# Patient Record
Sex: Male | Born: 1975 | Race: White | Hispanic: No | Marital: Married | Smoking: Never smoker
Health system: Southern US, Community
[De-identification: ages and names within clinical notes are randomized; demographics above are authoritative.]

## PROBLEM LIST (undated history)

## (undated) DIAGNOSIS — L84 Corns and callosities: Secondary | ICD-10-CM

## (undated) DIAGNOSIS — E119 Type 2 diabetes mellitus without complications: Secondary | ICD-10-CM

## (undated) DIAGNOSIS — Z713 Dietary counseling and surveillance: Secondary | ICD-10-CM

## (undated) DIAGNOSIS — R7989 Other specified abnormal findings of blood chemistry: Secondary | ICD-10-CM

## (undated) HISTORY — DX: Dietary counseling and surveillance: Z71.3

## (undated) HISTORY — DX: Corns and callosities: L84

## (undated) HISTORY — PX: VASECTOMY: SHX75

## (undated) HISTORY — PX: APPENDECTOMY: SHX54

## (undated) HISTORY — DX: Other specified abnormal findings of blood chemistry: R79.89

---

## 2008-06-16 ENCOUNTER — Ambulatory Visit: Payer: Self-pay | Admitting: Specialist

## 2011-03-06 ENCOUNTER — Emergency Department (HOSPITAL_COMMUNITY): Payer: BC Managed Care – PPO

## 2011-03-06 ENCOUNTER — Emergency Department (HOSPITAL_COMMUNITY)
Admission: EM | Admit: 2011-03-06 | Discharge: 2011-03-06 | Disposition: A | Discharge status: Home / Self Care | Payer: BC Managed Care – PPO | Attending: Emergency Medicine | Admitting: Emergency Medicine

## 2011-03-06 DIAGNOSIS — W208XXA Other cause of strike by thrown, projected or falling object, initial encounter: Secondary | ICD-10-CM | POA: Insufficient documentation

## 2011-03-06 DIAGNOSIS — Z794 Long term (current) use of insulin: Secondary | ICD-10-CM | POA: Insufficient documentation

## 2011-03-06 DIAGNOSIS — M79609 Pain in unspecified limb: Secondary | ICD-10-CM | POA: Insufficient documentation

## 2011-03-06 DIAGNOSIS — S9030XA Contusion of unspecified foot, initial encounter: Secondary | ICD-10-CM | POA: Insufficient documentation

## 2011-03-06 DIAGNOSIS — IMO0002 Reserved for concepts with insufficient information to code with codable children: Secondary | ICD-10-CM | POA: Insufficient documentation

## 2011-03-06 DIAGNOSIS — S99929A Unspecified injury of unspecified foot, initial encounter: Secondary | ICD-10-CM | POA: Insufficient documentation

## 2011-03-06 DIAGNOSIS — S8990XA Unspecified injury of unspecified lower leg, initial encounter: Secondary | ICD-10-CM | POA: Insufficient documentation

## 2011-03-06 DIAGNOSIS — Y92009 Unspecified place in unspecified non-institutional (private) residence as the place of occurrence of the external cause: Secondary | ICD-10-CM | POA: Insufficient documentation

## 2011-03-06 DIAGNOSIS — M25579 Pain in unspecified ankle and joints of unspecified foot: Secondary | ICD-10-CM | POA: Insufficient documentation

## 2011-03-06 DIAGNOSIS — E119 Type 2 diabetes mellitus without complications: Secondary | ICD-10-CM | POA: Insufficient documentation

## 2011-11-11 ENCOUNTER — Ambulatory Visit: Payer: Self-pay

## 2011-11-19 ENCOUNTER — Ambulatory Visit: Payer: Self-pay

## 2011-11-19 LAB — PSA: PSA: NORMAL

## 2011-12-20 ENCOUNTER — Ambulatory Visit: Payer: Self-pay

## 2012-01-17 ENCOUNTER — Ambulatory Visit: Payer: Self-pay

## 2013-06-28 LAB — LIPID PANEL
CHOLESTEROL: 165 mg/dL (ref 0–200)
HDL: 49 mg/dL (ref 35–70)
LDL CALC: 99 mg/dL
TRIGLYCERIDES: 85 mg/dL (ref 40–160)

## 2013-09-13 ENCOUNTER — Ambulatory Visit: Payer: Self-pay | Admitting: General Practice

## 2013-09-18 ENCOUNTER — Ambulatory Visit: Payer: Self-pay | Admitting: General Practice

## 2014-01-19 LAB — HEMOGLOBIN A1C: HEMOGLOBIN A1C: 6.6

## 2014-10-20 DIAGNOSIS — E109 Type 1 diabetes mellitus without complications: Secondary | ICD-10-CM | POA: Insufficient documentation

## 2015-01-03 ENCOUNTER — Emergency Department: Payer: Self-pay | Admitting: Emergency Medicine

## 2015-05-16 ENCOUNTER — Ambulatory Visit: Payer: Self-pay | Admitting: Urology

## 2015-05-20 ENCOUNTER — Encounter (HOSPITAL_COMMUNITY): Payer: Self-pay | Admitting: Emergency Medicine

## 2015-05-20 ENCOUNTER — Emergency Department (HOSPITAL_COMMUNITY): Payer: PRIVATE HEALTH INSURANCE

## 2015-05-20 DIAGNOSIS — E119 Type 2 diabetes mellitus without complications: Secondary | ICD-10-CM | POA: Diagnosis not present

## 2015-05-20 DIAGNOSIS — Z794 Long term (current) use of insulin: Secondary | ICD-10-CM | POA: Diagnosis not present

## 2015-05-20 DIAGNOSIS — R51 Headache: Secondary | ICD-10-CM | POA: Diagnosis not present

## 2015-05-20 DIAGNOSIS — R0789 Other chest pain: Secondary | ICD-10-CM | POA: Diagnosis not present

## 2015-05-20 DIAGNOSIS — R079 Chest pain, unspecified: Secondary | ICD-10-CM | POA: Diagnosis present

## 2015-05-20 NOTE — ED Notes (Signed)
Pt. reports intermittent mid/left chest pain for 1 1/2 weeks , mild SOB , no nausea or diaphoresis .

## 2015-05-21 ENCOUNTER — Emergency Department (HOSPITAL_COMMUNITY)
Admission: EM | Admit: 2015-05-21 | Discharge: 2015-05-21 | Disposition: A | Discharge status: Home / Self Care | Payer: PRIVATE HEALTH INSURANCE | Attending: Emergency Medicine | Admitting: Emergency Medicine

## 2015-05-21 DIAGNOSIS — R0789 Other chest pain: Secondary | ICD-10-CM

## 2015-05-21 HISTORY — DX: Type 2 diabetes mellitus without complications: E11.9

## 2015-05-21 LAB — BASIC METABOLIC PANEL WITH GFR
Anion gap: 10 (ref 5–15)
BUN: 10 mg/dL (ref 6–20)
CO2: 28 mmol/L (ref 22–32)
Calcium: 8.9 mg/dL (ref 8.9–10.3)
Chloride: 101 mmol/L (ref 101–111)
Creatinine, Ser: 1.33 mg/dL — ABNORMAL HIGH (ref 0.61–1.24)
GFR calc Af Amer: >60 mL/min (ref >60)
GFR calc non Af Amer: >60 mL/min (ref >60)
Glucose, Bld: 198 mg/dL — ABNORMAL HIGH (ref 65–99)
Potassium: 3.5 mmol/L (ref 3.5–5.1)
Sodium: 139 mmol/L (ref 135–145)

## 2015-05-21 LAB — CBC
HCT: 48.3 % (ref 39.0–52.0)
Hemoglobin: 17 g/dL (ref 13.0–17.0)
MCH: 30.1 pg (ref 26.0–34.0)
MCHC: 35.2 g/dL (ref 30.0–36.0)
MCV: 85.5 fL (ref 78.0–100.0)
Platelets: 228 K/uL (ref 150–400)
RBC: 5.65 MIL/uL (ref 4.22–5.81)
RDW: 12.5 % (ref 11.5–15.5)
WBC: 6.9 K/uL (ref 4.0–10.5)

## 2015-05-21 LAB — BRAIN NATRIURETIC PEPTIDE: B Natriuretic Peptide: 6.9 pg/mL (ref 0.0–100.0)

## 2015-05-21 LAB — I-STAT TROPONIN, ED: TROPONIN I, POC: 0 ng/mL (ref 0.00–0.08)

## 2015-05-21 LAB — D-DIMER, QUANTITATIVE (NOT AT ARMC)

## 2015-05-21 MED ORDER — DIPHENHYDRAMINE HCL 50 MG/ML IJ SOLN
25.0000 mg | Freq: Once | INTRAMUSCULAR | Status: AC
  Administered 2015-05-21: 25 mg via INTRAVENOUS
  Filled 2015-05-21: qty 1

## 2015-05-21 MED ORDER — SODIUM CHLORIDE 0.9 % IV BOLUS (SEPSIS)
1000.0000 mL | INTRAVENOUS | Status: AC
  Administered 2015-05-21: 1000 mL via INTRAVENOUS

## 2015-05-21 MED ORDER — METOCLOPRAMIDE HCL 5 MG/ML IJ SOLN
5.0000 mg | Freq: Once | INTRAMUSCULAR | Status: AC
  Administered 2015-05-21: 5 mg via INTRAVENOUS
  Filled 2015-05-21: qty 2

## 2015-05-21 NOTE — Discharge Instructions (Signed)

## 2015-05-21 NOTE — ED Provider Notes (Addendum)
CSN: [id number]     Arrival date & time 05/20/15  2329 History  This chart was scribed for Purvis SheffieldForrest Seiya Silsby, MD by Murriel HopperAlec Bankhead, ED Scribe. This patient was seen in room B14C/B14C and the patient's care was started at 12:45 AM.      Chief Complaint  Patient presents with  . Chest Pain      Patient is a 39 y.o. male presenting with chest pain. The history is provided by the patient. No language interpreter was used.  Chest Pain Pain location:  Unable to specify Pain quality: pressure   Pain radiates to:  L arm and R arm Pain radiates to the back: no   Pain severity:  Mild Onset quality:  Sudden Timing:  Intermittent Progression:  Waxing and waning Chronicity:  Recurrent Associated symptoms: headache   Associated symptoms: no abdominal pain, no back pain, no cough and no fatigue      HPI Comments: Carlos Wilkins is a 39 y.o. male with a Hx of DM who presents to the Emergency Department complaining of intermittent, waxing and waning, recurrent, 2/10, pressure chest pain with associated headache and hypertension that has been present for a week and a half. Pt reports that he went to a fire station earlier this evening to have his BP checked and states his initial reading was 173/100, and then had an EKG performed by EMS, which they stated was normal. Pt states that he feels, "like his chest is going to explode" and notes that it came on while he was driving today, but notes that it has been occurring while he is at work for the past week and a half. Pt also reports that earlier today he had pain radiating to his fingers when the onset of his chest pain occurred. Pt reports taking aleve for his pain with little relief, and denies having chest pain before a week and a half ago. Pt denies a hx of heart disease, blood clots. Pt reports having a vasectomy 2 weeks ago.  Past Medical History  Diagnosis Date  . Diabetes mellitus without complication    Past Surgical History  Procedure  Laterality Date  . Appendectomy    . Vasectomy     No family history on file. History  Substance Use Topics  . Smoking status: Never Smoker   . Smokeless tobacco: Not on file  . Alcohol Use: No    Review of Systems  Constitutional: Negative for appetite change and fatigue.  HENT: Negative for congestion, ear discharge and sinus pressure.   Eyes: Negative for discharge.  Respiratory: Negative for cough.   Cardiovascular: Negative for chest pain.  Gastrointestinal: Negative for abdominal pain and diarrhea.  Genitourinary: Negative for frequency and hematuria.  Musculoskeletal: Negative for back pain.  Skin: Negative for rash.  Neurological: Positive for headaches. Negative for seizures.  Psychiatric/Behavioral: Negative for hallucinations.  All other systems reviewed and are negative.     Allergies  Review of patient's allergies indicates no known allergies.  Home Medications   Prior to Admission medications   Medication Sig Start Date End Date Taking? Authorizing Provider  Insulin Human (INSULIN PUMP) SOLN Inject into the skin continuous. Humalog Insulin   Yes Historical Provider, MD   BP 144/87 mmHg  Pulse 98  Temp(Src) 98.3 F (36.8 C) (Oral)  Resp 20  Ht 5\' 3"  (1.6 m)  Wt 190 lb (86.183 kg)  BMI 33.67 kg/m2  SpO2 97% Physical Exam  Constitutional: He is oriented to person, place, and  time. He appears well-developed and well-nourished.  HENT:  Head: Normocephalic and atraumatic.  Right Ear: External ear normal.  Left Ear: External ear normal.  Nose: Nose normal.  Mouth/Throat: Oropharynx is clear and moist. No oropharyngeal exudate.  Eyes: Conjunctivae and EOM are normal. Pupils are equal, round, and reactive to light.  Neck: Normal range of motion. Neck supple.  Cardiovascular: Normal rate, regular rhythm, normal heart sounds and intact distal pulses.  Exam reveals no gallop and no friction rub.   No murmur heard. Pulmonary/Chest: Effort normal and breath  sounds normal. No respiratory distress. He has no wheezes.  Abdominal: Soft. Bowel sounds are normal. He exhibits no distension. There is no tenderness. There is no rebound and no guarding.  Musculoskeletal: Normal range of motion. He exhibits no edema or tenderness.  Neurological: He is alert and oriented to person, place, and time.  Skin: Skin is warm and dry.  Psychiatric: He has a normal mood and affect. His behavior is normal.  Nursing note and vitals reviewed.   ED Course  Procedures (including critical care time)  DIAGNOSTIC STUDIES: Oxygen Saturation is 97% on room air, normal by my interpretation.    COORDINATION OF CARE: 12:55 AM Discussed treatment plan with pt at bedside and pt agreed to plan.   Labs Review Labs Reviewed  BASIC METABOLIC PANEL - Abnormal; Notable for the following:    Glucose, Bld 198 (*)    Creatinine, Ser 1.33 (*)    All other components within normal limits  CBC  BRAIN NATRIURETIC PEPTIDE  D-DIMER, QUANTITATIVE (NOT AT [facility])  Rosezena Sensor, ED    Imaging Review Dg Chest 2 View  05/21/2015   CLINICAL DATA:  Mid to left-sided chest pain and dyspnea.  EXAM: CHEST  2 VIEW  COMPARISON:  01/03/2015  FINDINGS: The heart size and mediastinal contours are within normal limits. Both lungs are clear. The visualized skeletal structures are unremarkable.  IMPRESSION: No active cardiopulmonary disease.   Electronically Signed   By: Ellery Plunk M.D.   On: 05/21/2015 01:08     EKG Interpretation   Date/Time:  Saturday May 20 2015 23:36:19 EDT Ventricular Rate:  95 PR Interval:  130 QRS Duration: 78 QT Interval:  322 QTC Calculation: 404 R Axis:   81 Text Interpretation:  Normal sinus rhythm Right atrial enlargement  Borderline ECG No significant change since last tracing Confirmed by  Russell Quinney  MD, Saryah Loper ([id number]) on 05/21/2015 12:42:29 AM      MDM   Final diagnoses:  Other chest pain    2:41 AM 39 y.o. male with history of diabetes who  presents with left-sided chest pressure which he has had for approximately 1.5 weeks. Pain is constant and does seem to wax and wane throughout the day. Does not seem to be related to exertion. Does seem worse with deep breathing. He is afebrile and vital signs are unremarkable here. Screening lab work including d-dimer noncontributory. Patient continues to appear well on exam. CP atypical. Only real RF is DM.   2:41 AM: I interpreted/reviewed the labs and/or imaging which were non-contributory.  Low risk for MACE per HEART score.  I have discussed the diagnosis/risks/treatment options with the patient and family and believe the pt to be eligible for discharge home to follow-up with his pcp in 2-3 days for further cardiac workup. We also discussed returning to the ED immediately if new or worsening sx occur. We discussed the sx which are most concerning (e.g., worsening pain, sob, fever)  that necessitate immediate return. Medications administered to the patient during their visit and any new prescriptions provided to the patient are listed below.  Medications given during this visit Medications  sodium chloride 0.9 % bolus 1,000 mL (1,000 mLs Intravenous New Bag/Given 05/21/15 0132)  metoCLOPramide (REGLAN) injection 5 mg (5 mg Intravenous Given 05/21/15 0131)  diphenhydrAMINE (BENADRYL) injection 25 mg (25 mg Intravenous Given 05/21/15 0132)    New Prescriptions   No medications on file      I personally performed the services described in this documentation, which was scribed in my presence. The recorded information has been reviewed and is accurate.    Purvis Sheffield, MD 05/21/15 6045  Purvis Sheffield, MD 05/31/15 [id number]

## 2015-09-22 ENCOUNTER — Encounter: Payer: Self-pay | Admitting: *Deleted

## 2015-09-22 ENCOUNTER — Emergency Department
Admission: EM | Admit: 2015-09-22 | Discharge: 2015-09-22 | Disposition: A | Discharge status: Home / Self Care | Payer: No Typology Code available for payment source | Attending: Emergency Medicine | Admitting: Emergency Medicine

## 2015-09-22 DIAGNOSIS — Y9389 Activity, other specified: Secondary | ICD-10-CM | POA: Insufficient documentation

## 2015-09-22 DIAGNOSIS — E119 Type 2 diabetes mellitus without complications: Secondary | ICD-10-CM | POA: Diagnosis not present

## 2015-09-22 DIAGNOSIS — Y998 Other external cause status: Secondary | ICD-10-CM | POA: Diagnosis not present

## 2015-09-22 DIAGNOSIS — X58XXXA Exposure to other specified factors, initial encounter: Secondary | ICD-10-CM | POA: Diagnosis not present

## 2015-09-22 DIAGNOSIS — Z794 Long term (current) use of insulin: Secondary | ICD-10-CM | POA: Insufficient documentation

## 2015-09-22 DIAGNOSIS — S8992XA Unspecified injury of left lower leg, initial encounter: Secondary | ICD-10-CM | POA: Insufficient documentation

## 2015-09-22 DIAGNOSIS — Y9289 Other specified places as the place of occurrence of the external cause: Secondary | ICD-10-CM | POA: Diagnosis not present

## 2015-09-22 DIAGNOSIS — M25562 Pain in left knee: Secondary | ICD-10-CM

## 2015-09-22 MED ORDER — OXYCODONE-ACETAMINOPHEN 5-325 MG PO TABS: 1.0000 | ORAL_TABLET | Freq: Four times a day (QID) | ORAL | Status: DC | PRN

## 2015-09-22 MED ORDER — MELOXICAM 15 MG PO TABS: 15.0000 mg | ORAL_TABLET | Freq: Every day | ORAL | Status: DC

## 2015-09-22 NOTE — ED Provider Notes (Signed)
[facility] Emergency Department Provider Note  ____________________________________________  Time seen: Approximately 9:57 PM  I have reviewed the triage vital signs and the nursing notes.   HISTORY  Chief Complaint Leg Pain    HPI Carlos Wilkins is a 39 y.o. male who presents to emergency department complaining of pain behind his left knee. He states that he has had a several month history of some "pulled muscle sensation" in the posterior aspect of his knee. Tonight he was loading the dishwasher planted on that foot and felt a popping sensation in the posterior aspect of his knee. He reports immediate pain and swelling to posterior knee. Denies any other injury. He states the pain is sharp, constant, and severe, worse with movement.   Past Medical History  Diagnosis Date  . Diabetes mellitus without complication (HCC)     There are no active problems to display for this patient.   Past Surgical History  Procedure Laterality Date  . Appendectomy    . Vasectomy      Current Outpatient Rx  Name  Route  Sig  Dispense  Refill  . insulin glargine (LANTUS) 100 UNIT/ML injection   Subcutaneous   Inject into the skin at bedtime.         . insulin NPH Human (HUMULIN N,NOVOLIN N) 100 UNIT/ML injection   Subcutaneous   Inject into the skin.         . meloxicam (MOBIC) 15 MG tablet   Oral   Take 1 tablet (15 mg total) by mouth daily.   30 tablet   0   . oxyCODONE-acetaminophen (ROXICET) 5-325 MG tablet   Oral   Take 1 tablet by mouth every 6 (six) hours as needed for severe pain.   20 tablet   0     Allergies Review of patient's allergies indicates no known allergies.  History reviewed. No pertinent family history.  Social History Social History  Substance Use Topics  . Smoking status: Never Smoker   . Smokeless tobacco: None  . Alcohol Use: No    Review of Systems Constitutional: No fever/chills Eyes: No visual changes. ENT:  No sore throat. Cardiovascular: Denies chest pain. Respiratory: Denies shortness of breath. Gastrointestinal: No abdominal pain.  No nausea, no vomiting.  No diarrhea.  No constipation. Genitourinary: Negative for dysuria. Musculoskeletal: Negative for back pain. Dorsiflex left knee pain. Skin: Negative for rash. Neurological: Negative for headaches, focal weakness or numbness.  10-point ROS otherwise negative.  ____________________________________________   PHYSICAL EXAM:  VITAL SIGNS: ED Triage Vitals  Enc Vitals Group     BP 09/22/15 2147 154/95 mmHg     Pulse Rate 09/22/15 2147 64     Resp 09/22/15 2147 18     Temp 09/22/15 2147 98.7 F (37.1 C)     Temp Source 09/22/15 2147 Oral     SpO2 09/22/15 2147 99 %     Weight 09/22/15 2147 200 lb (90.719 kg)     Height 09/22/15 2147  (1.6 m)     Head Cir --      Peak Flow --      Pain Score 09/22/15 2151 6     Pain Loc --      Pain Edu? --      Excl. in GC? --     Constitutional: Alert and oriented. Well appearing and in no acute distress. Eyes: Conjunctivae are normal. PERRL. EOMI. Head: Atraumatic. Nose: No congestion/rhinnorhea. Mouth/Throat: Mucous membranes are moist.  Oropharynx non-erythematous. Neck: No stridor.   Cardiovascular: Normal rate, regular rhythm. Grossly normal heart sounds.  Good peripheral circulation. Respiratory: Normal respiratory effort.  No retractions. Lungs CTAB. Gastrointestinal: Soft and nontender. No distention. No abdominal bruits. No CVA tenderness. Musculoskeletal: No visible deformity when comparing right knee with left knee. Patient is tender pop to palpation over the posterior aspect of the knee. Edema is noted to palpation. No tenderness to palpation over anterior aspect or joint lines. Anterior drawer test is positive. Varus and valgus is negative. No joint effusions. Neurologic:  Normal speech and language. No gross focal neurologic deficits are appreciated. No gait  instability. Skin:  Skin is warm, dry and intact. No rash noted. Psychiatric: Mood and affect are normal. Speech and behavior are normal.  ____________________________________________   LABS (all labs ordered are listed, but only abnormal results are displayed)  Labs Reviewed - No data to display ____________________________________________  EKG   ____________________________________________  RADIOLOGY   ____________________________________________   PROCEDURES  Procedure(s) performed: None  Critical Care performed: No  ____________________________________________   INITIAL IMPRESSION / ASSESSMENT AND PLAN / ED COURSE  Pertinent labs & imaging results that were available during my care of the patient were reviewed by me and considered in my medical decision making (see chart for details).  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 I have informed the patient that I am unable to provide a definitive diagnosis without further imaging. He verbalizes understanding of this. I am referring the patient to orthopedics for further follow-up and care. He verbalizes understanding same. The patient is to wear a knee brace and take medications for symptomatic control. Patient is to use ice to reduce the inflammation. Verbalizes understanding of the treatment plan and verbalizes compliance with same. ____________________________________________   FINAL CLINICAL IMPRESSION(S) / ED DIAGNOSES  Final diagnoses:  Knee pain, acute, left      Racheal PatchesJonathan D Jawaan Adachi, PA-C 09/22/15 2213  Sharyn CreamerMark Quale, MD 09/23/15 [id number]

## 2015-09-22 NOTE — Discharge Instructions (Signed)
Cryotherapy °Cryotherapy means treatment with cold. Ice or gel packs can be used to reduce both pain and swelling. Ice is the most helpful within the first 24 to 48 hours after an injury or flare-up from overusing a muscle or joint. Sprains, strains, spasms, burning pain, shooting pain, and aches can all be eased with ice. Ice can also be used when recovering from surgery. Ice is effective, has very few side effects, and is safe for most people to use. °PRECAUTIONS  °Ice is not a safe treatment option for people with: °· Raynaud phenomenon. This is a condition affecting small blood vessels in the extremities. Exposure to cold may cause your problems to return. °· Cold hypersensitivity. There are many forms of cold hypersensitivity, including: °· Cold urticaria. Red, itchy hives appear on the skin when the tissues begin to warm after being iced. °· Cold erythema. This is a red, itchy rash caused by exposure to cold. °· Cold hemoglobinuria. Red blood cells break down when the tissues begin to warm after being iced. The hemoglobin that carry oxygen are passed into the urine because they cannot combine with blood proteins fast enough. °· Numbness or altered sensitivity in the area being iced. °If you have any of the following conditions, do not use ice until you have discussed cryotherapy with your caregiver: °· Heart conditions, such as arrhythmia, angina, or chronic heart disease. °· High blood pressure. °· Healing wounds or open skin in the area being iced. °· Current infections. °· Rheumatoid arthritis. °· Poor circulation. °· Diabetes. °Ice slows the blood flow in the region it is applied. This is beneficial when trying to stop inflamed tissues from spreading irritating chemicals to surrounding tissues. However, if you expose your skin to cold temperatures for too long or without the proper protection, you can damage your skin or nerves. Watch for signs of skin damage due to cold. °HOME CARE INSTRUCTIONS °Follow  these tips to use ice and cold packs safely. °· Place a dry or damp towel between the ice and skin. A damp towel will cool the skin more quickly, so you may need to shorten the time that the ice is used. °· For a more rapid response, add gentle compression to the ice. °· Ice for no more than 10 to 20 minutes at a time. The bonier the area you are icing, the less time it will take to get the benefits of ice. °· Check your skin after 5 minutes to make sure there are no signs of a poor response to cold or skin damage. °· Rest 20 minutes or more between uses. °· Once your skin is numb, you can end your treatment. You can test numbness by very lightly touching your skin. The touch should be so light that you do not see the skin dimple from the pressure of your fingertip. When using ice, most people will feel these normal sensations in this order: cold, burning, aching, and numbness. °· Do not use ice on someone who cannot communicate their responses to pain, such as small children or people with dementia. °HOW TO MAKE AN ICE PACK °Ice packs are the most common way to use ice therapy. Other methods include ice massage, ice baths, and cryosprays. Muscle creams that cause a cold, tingly feeling do not offer the same benefits that ice offers and should not be used as a substitute unless recommended by your caregiver. °To make an ice pack, do one of the following: °· Place crushed ice or a   bag of frozen vegetables in a sealable plastic bag. Squeeze out the excess air. Place this bag inside another plastic bag. Slide the bag into a pillowcase or place a damp towel between your skin and the bag.  Mix 3 parts water with 1 part rubbing alcohol. Freeze the mixture in a sealable plastic bag. When you remove the mixture from the freezer, it will be slushy. Squeeze out the excess air. Place this bag inside another plastic bag. Slide the bag into a pillowcase or place a damp towel between your skin and the bag. SEEK MEDICAL CARE  IF:  You develop Tullis spots on your skin. This may give the skin a blotchy (mottled) appearance.  Your skin turns blue or pale.  Your skin becomes waxy or hard.  Your swelling gets worse. MAKE SURE YOU:   Understand these instructions.  Will watch your condition.  Will get help right away if you are not doing well or get worse.   This information is not intended to replace advice given to you by your health care provider. Make sure you discuss any questions you have with your health care provider.   Document Released: 07/01/2011 Document Revised: 11/25/2014 Document Reviewed: 07/01/2011 Elsevier Interactive Patient Education 2016 [employer].  How to Use a Knee Brace A knee brace is a device that you wear to support your knee, especially if the knee is healing after an injury or surgery. There are several types of knee braces. Some are designed to prevent an injury (prophylactic brace). These are often worn during sports. Others support an injured knee (functional brace) or keep it still while it heals (rehabilitative brace). People with severe arthritis of the knee may benefit from a brace that takes some pressure off the knee (unloader brace). Most knee braces are made from a combination of cloth and metal or plastic.  You may need to wear a knee brace to:  Relieve knee pain.  Help your knee support your weight (improve stability).  Help you walk farther (improve mobility).  Prevent injury.  Support your knee while it heals from surgery or from an injury. RISKS AND COMPLICATIONS Generally, knee braces are very safe to wear. However, problems may occur, including:  Skin irritation that may lead to infection.  Making your condition worse if you wear the brace in the wrong way. HOW TO USE A KNEE BRACE Different braces will have different instructions for use. Your health care provider will tell you or show you:  How to put on your brace.  How to adjust the  brace.  When and how often to wear the brace.  How to remove the brace.  If you will need any assistive devices in addition to the brace, such as crutches or a cane. In general, your brace should:  Have the hinge of the brace line up with the bend of your knee.  Have straps, hooks, or tapes that fasten snugly around your leg.  Not feel too tight or too loose. HOW TO CARE FOR A KNEE BRACE  Check your brace often for signs of damage, such as loose connections or attachments. Your knee brace may get damaged or wear out during normal use.  Wash the fabric parts of your brace with soap and water.  Read the insert that comes with your brace for other specific care instructions. SEEK MEDICAL CARE IF:  Your knee brace is too loose or too tight and you cannot adjust it.  Your knee brace causes skin  redness, swelling, bruising, or irritation.  Your knee brace is not helping.  Your knee brace is making your knee pain worse.   This information is not intended to replace advice given to you by your health care provider. Make sure you discuss any questions you have with your health care provider.   Document Released: 01/25/2004 Document Revised: 07/26/2015 Document Reviewed: 02/27/2015 Elsevier Interactive Patient Education 2016 Else[employer] Inc.   Knee Pain Knee pain is a very common symptom and can have many causes. Knee pain often goes away when you follow your health care provider's instructions for relieving pain and discomfort at home. However, knee pain can develop into a condition that needs treatment. Some conditions may include:  Arthritis caused by wear and tear (osteoarthritis).  Arthritis caused by swelling and irritation (rheumatoid arthritis or gout).  A cyst or growth in your knee.  An infection in your knee joint.  An injury that will not heal.  Damage, swelling, or irritation of the tissues that support your knee (torn ligaments or tendinitis). If your knee pain  continues, additional tests may be ordered to diagnose your condition. Tests may include X-rays or other imaging studies of your knee. You may also need to have fluid removed from your knee. Treatment for ongoing knee pain depends on the cause, but treatment may include:  Medicines to relieve pain or swelling.  Steroid injections in your knee.  Physical therapy.  Surgery. HOME CARE INSTRUCTIONS  Take medicines only as directed by your health care provider.  Rest your knee and keep it raised (elevated) while you are resting.  Do not do things that cause or worsen pain.  Avoid high-impact activities or exercises, such as running, jumping rope, or doing jumping jacks.  Apply ice to the knee area:  Put ice in a plastic bag.  Place a towel between your skin and the bag.  Leave the ice on for 20 minutes, 2-3 times a day.  Ask your health care provider if you should wear an elastic knee support.  Keep a pillow under your knee when you sleep.  Lose weight if you are overweight. Extra weight can put pressure on your knee.  Do not use any tobacco products, including cigarettes, chewing tobacco, or electronic cigarettes. If you need help quitting, ask your health care provider. Smoking may slow the healing of any bone and joint problems that you may have. SEEK MEDICAL CARE IF:  Your knee pain continues, changes, or gets worse.  You have a fever along with knee pain.  Your knee buckles or locks up.  Your knee becomes more swollen. SEEK IMMEDIATE MEDICAL CARE IF:   Your knee joint feels hot to the touch.  You have chest pain or trouble breathing.   This information is not intended to replace advice given to you by your health care provider. Make sure you discuss any questions you have with your health care provider.   Document Released: 09/01/2007 Document Revised: 11/25/2014 Document Reviewed: 06/20/2014 Elsevier Interactive Patient Education [employer] Else[employer] Inc.

## 2015-09-22 NOTE — ED Notes (Signed)
Worsening leg pain x several months.  Extreme pain tonight behind his left knee.  Tenderness on palpation.  No warmth, redness noted.

## 2016-02-07 ENCOUNTER — Ambulatory Visit (INDEPENDENT_AMBULATORY_CARE_PROVIDER_SITE_OTHER): Payer: BLUE CROSS/BLUE SHIELD | Admitting: Family Medicine

## 2016-02-07 ENCOUNTER — Other Ambulatory Visit
Admission: RE | Admit: 2016-02-07 | Discharge: 2016-02-07 | Disposition: A | Discharge status: Home / Self Care | Payer: BLUE CROSS/BLUE SHIELD | Source: Ambulatory Visit | Attending: Family Medicine | Admitting: Family Medicine

## 2016-02-07 ENCOUNTER — Encounter: Payer: Self-pay | Admitting: Family Medicine

## 2016-02-07 ENCOUNTER — Ambulatory Visit
Admission: RE | Admit: 2016-02-07 | Discharge: 2016-02-07 | Disposition: A | Discharge status: Home / Self Care | Payer: BLUE CROSS/BLUE SHIELD | Source: Ambulatory Visit | Attending: Family Medicine | Admitting: Family Medicine

## 2016-02-07 VITALS — BP 124/78 | HR 102 | Temp 98.5°F | Resp 16 | Ht 63.0 in | Wt 221.1 lb

## 2016-02-07 DIAGNOSIS — E291 Testicular hypofunction: Secondary | ICD-10-CM

## 2016-02-07 DIAGNOSIS — R1033 Periumbilical pain: Secondary | ICD-10-CM | POA: Insufficient documentation

## 2016-02-07 DIAGNOSIS — R194 Change in bowel habit: Secondary | ICD-10-CM | POA: Diagnosis not present

## 2016-02-07 DIAGNOSIS — Z79899 Other long term (current) drug therapy: Secondary | ICD-10-CM

## 2016-02-07 DIAGNOSIS — F329 Major depressive disorder, single episode, unspecified: Secondary | ICD-10-CM | POA: Insufficient documentation

## 2016-02-07 DIAGNOSIS — E109 Type 1 diabetes mellitus without complications: Secondary | ICD-10-CM | POA: Diagnosis not present

## 2016-02-07 DIAGNOSIS — R7989 Other specified abnormal findings of blood chemistry: Secondary | ICD-10-CM

## 2016-02-07 DIAGNOSIS — K5730 Diverticulosis of large intestine without perforation or abscess without bleeding: Secondary | ICD-10-CM | POA: Insufficient documentation

## 2016-02-07 DIAGNOSIS — R635 Abnormal weight gain: Secondary | ICD-10-CM | POA: Diagnosis present

## 2016-02-07 DIAGNOSIS — Z13220 Encounter for screening for lipoid disorders: Secondary | ICD-10-CM | POA: Diagnosis not present

## 2016-02-07 DIAGNOSIS — F32A Depression, unspecified: Secondary | ICD-10-CM

## 2016-02-07 LAB — COMPREHENSIVE METABOLIC PANEL
ALBUMIN: 4.3 g/dL (ref 3.5–5.0)
ALT: 18 U/L (ref 17–63)
ANION GAP: 6 (ref 5–15)
AST: 20 U/L (ref 15–41)
Alkaline Phosphatase: 56 U/L (ref 38–126)
BUN: 11 mg/dL (ref 6–20)
CALCIUM: 9.3 mg/dL (ref 8.9–10.3)
CO2: 31 mmol/L (ref 22–32)
Chloride: 100 mmol/L — ABNORMAL LOW (ref 101–111)
Creatinine, Ser: 1.03 mg/dL (ref 0.61–1.24)
GFR calc Af Amer: >60 mL/min (ref >60)
GFR calc non Af Amer: >60 mL/min (ref >60)
GLUCOSE: 163 mg/dL — AB (ref 65–99)
POTASSIUM: 4.2 mmol/L (ref 3.5–5.1)
SODIUM: 137 mmol/L (ref 135–145)
TOTAL PROTEIN: 7.4 g/dL (ref 6.5–8.1)
Total Bilirubin: 2.4 mg/dL — ABNORMAL HIGH (ref 0.3–1.2)

## 2016-02-07 LAB — CBC WITH DIFFERENTIAL/PLATELET
Basophils Absolute: 0.1 10*3/uL (ref 0–0.1)
Basophils Relative: 1 %
EOS PCT: 4 %
Eosinophils Absolute: 0.3 10*3/uL (ref 0–0.7)
HEMATOCRIT: 46.2 % (ref 40.0–52.0)
Hemoglobin: 16.3 g/dL (ref 13.0–18.0)
LYMPHS PCT: 18 %
Lymphs Abs: 1.2 10*3/uL (ref 1.0–3.6)
MCH: 30.8 pg (ref 26.0–34.0)
MCHC: 35.2 g/dL (ref 32.0–36.0)
MCV: 87.6 fL (ref 80.0–100.0)
MONO ABS: 0.6 10*3/uL (ref 0.2–1.0)
MONOS PCT: 9 %
NEUTROS ABS: 4.9 10*3/uL (ref 1.4–6.5)
Neutrophils Relative %: 68 %
PLATELETS: 184 10*3/uL (ref 150–440)
RBC: 5.28 MIL/uL (ref 4.40–5.90)
RDW: 13 % (ref 11.5–14.5)
WBC: 7.1 10*3/uL (ref 3.8–10.6)

## 2016-02-07 LAB — LIPID PANEL
Cholesterol: 182 mg/dL (ref 0–200)
HDL: 49 mg/dL (ref >40)
LDL CALC: 111 mg/dL — AB (ref 0–99)
Total CHOL/HDL Ratio: 3.7 RATIO
Triglycerides: 111 mg/dL (ref <150)
VLDL: 22 mg/dL (ref 0–40)

## 2016-02-07 LAB — TSH: TSH: 3.292 u[IU]/mL (ref 0.350–4.500)

## 2016-02-07 MED ORDER — MELOXICAM 15 MG PO TABS: 15.0000 mg | ORAL_TABLET | Freq: Every day | ORAL | Status: DC

## 2016-02-07 MED ORDER — INSULIN LISPRO 100 UNIT/ML ~~LOC~~ SOLN: 2.0000 [IU] | Freq: Four times a day (QID) | SUBCUTANEOUS | Status: DC | PRN

## 2016-02-07 MED ORDER — CITALOPRAM HYDROBROMIDE 20 MG PO TABS: 20.0000 mg | ORAL_TABLET | Freq: Every day | ORAL | Status: DC

## 2016-02-07 MED ORDER — IOHEXOL 300 MG/ML  SOLN
100.0000 mL | Freq: Once | INTRAMUSCULAR | Status: AC | PRN
  Administered 2016-02-07: 100 mL via INTRAVENOUS

## 2016-02-07 MED ORDER — CIPROFLOXACIN HCL 250 MG PO TABS: 250.0000 mg | ORAL_TABLET | Freq: Two times a day (BID) | ORAL | Status: DC

## 2016-02-07 NOTE — Progress Notes (Signed)
Name: Carlos Wilkins   MRN: [id number]    DOB: 01-27-1976   Date:02/07/2016       Progress Note  Subjective  Chief Complaint  Chief Complaint  Patient presents with  . Umbilical Hernia    patient was diagnosed with an umbilical hernia at the urgent care [place]. he 1st noticed the pain last week.   . Nausea    HPI  Patient has developed periumbilical pain about one week ago. He states pain is worse when touching the area, does not radiate. It is a constant 3-4/10 but it can go higher with pressure or movement. He denies fever or chills, no injuries ( his last gym work out was a few days before the pain started ), he has noticed mild change in bowel movements ( less frequent - about once a day instead of a few times daily ), no blood in stools, no vomiting. He also denies urinary frequency or hematuria. He has been with same sexual for over one year. He went to Urgent care and was diagnosed with an umbilical hernia and advised to follow up with his pcp  DMI: he was seeing endocrinologist in [facility] but had a change in insurance and his last visit there was last Summer. He prefers seeing a new provider locally. He is currently taking insulin, but not sure of the name of his long acting insulin type. He is due for a hgbA1C , he denies any complications from DM.   Depression: he states he has been doing well on Celexa, he states when he is depressed he gets moody. He denies suicidal thoughts or anhedonia at this time  Hypogonadism: doing well at this time, and does not want to have labs done   Patient Active Problem List   Diagnosis Date Noted  . Low testosterone 02/07/2016  . Depression 02/07/2016  . Type 1 diabetes mellitus ([place]) 10/20/2014    Past Surgical History  Procedure Laterality Date  . Appendectomy    . Vasectomy      Family History  Problem Relation Age of Onset  . Post-traumatic stress disorder Father     [place] Vet  . Diabetes Father     Social History    Social History  . Marital Status: Married    Spouse Name: N/A  . Number of Children: N/A  . Years of Education: N/A   Occupational History  . Not on file.   Social History Main Topics  . Smoking status: Never Smoker   . Smokeless tobacco: Not on file  . Alcohol Use: No  . Drug Use: No  . Sexual Activity:    Partners: Female   Other Topics Concern  . Not on file   Social History Narrative     Current outpatient prescriptions:  .  Blood Glucose Monitoring Suppl (CONTOUR NEXT EZ MONITOR) w/Device KIT, , Disp: , Rfl:  .  citalopram (CELEXA) 20 MG tablet, Take 1 tablet (20 mg total) by mouth daily., Disp: 90 tablet, Rfl: 1 .  glucose blood (BAYER CONTOUR NEXT TEST) test strip, , Disp: , Rfl:  .  insulin glargine (LANTUS) 100 UNIT/ML injection, Inject into the skin at bedtime., Disp: , Rfl:  .  insulin lispro (HUMALOG) 100 UNIT/ML injection, Inject 0.02-0.2 mLs (2-20 Units total) into the skin 4 (four) times daily as needed for high blood sugar., Disp: 10 mL, Rfl: 0 .  Multiple Vitamin (MULTIVITAMIN) capsule, Take by mouth., Disp: , Rfl:  .  meloxicam (  MOBIC) 15 MG tablet, Take 1 tablet (15 mg total) by mouth daily., Disp: 30 tablet, Rfl: 0  No Known Allergies   ROS  Ten systems reviewed and is negative except as mentioned in HPI  He also has noticed an increase in his weight over the past couple of weeks  Objective  Filed Vitals:   02/07/16 1153  BP: 124/78  Pulse: 102  Temp: 98.5 F (36.9 C)  TempSrc: Oral  Resp: 16  Height: 5' 3"  (1.6 m)  Weight: 221 lb 1.6 oz (100.29 kg)  SpO2: 98%    Body mass index is 39.18 kg/(m^2).  Physical Exam   Constitutional: Patient appears well-developed and well-nourished. Very muscular. No distress.  HEENT: head atraumatic, normocephalic, pupils equal and reactive to light, neck supple, throat within normal limits Cardiovascular: Normal rate, regular rhythm and normal heart sounds.  No murmur heard. No BLE  edema. Pulmonary/Chest: Effort normal and breath sounds normal. No respiratory distress. Abdominal: Soft. Severe periumbilical and supra pubic tenderness during exam, with voluntary guarding, no bulging noticed during exam on umbilcus Psychiatric: Patient has a normal mood and affect. behavior is normal. Judgment and thought content normal.  Recent Results (from the past 2160 hour(s))  Comprehensive metabolic panel     Status: Abnormal   Collection Time: 02/07/16  1:45 PM  Result Value Ref Range   Sodium 137 135 - 145 mmol/L   Potassium 4.2 3.5 - 5.1 mmol/L   Chloride 100 (L) 101 - 111 mmol/L   CO2 31 22 - 32 mmol/L   Glucose, Bld 163 (H) 65 - 99 mg/dL   BUN 11 6 - 20 mg/dL   Creatinine, Ser 1.03 0.61 - 1.24 mg/dL   Calcium 9.3 8.9 - 10.3 mg/dL   Total Protein 7.4 6.5 - 8.1 g/dL   Albumin 4.3 3.5 - 5.0 g/dL   AST 20 15 - 41 U/L   ALT 18 17 - 63 U/L   Alkaline Phosphatase 56 38 - 126 U/L   Total Bilirubin 2.4 (H) 0.3 - 1.2 mg/dL   GFR calc non Af Amer >60 >60 mL/min   GFR calc Af Amer >60 >60 mL/min    Comment: (NOTE) The eGFR has been calculated using the CKD EPI equation. This calculation has not been validated in all clinical situations. eGFR's persistently <60 mL/min signify possible Chronic Kidney Disease.    Anion gap 6 5 - 15  CBC with Differential/Platelet     Status: None   Collection Time: 02/07/16  1:45 PM  Result Value Ref Range   WBC 7.1 3.8 - 10.6 K/uL   RBC 5.28 4.40 - 5.90 MIL/uL   Hemoglobin 16.3 13.0 - 18.0 g/dL   HCT 46.2 40.0 - 52.0 %   MCV 87.6 80.0 - 100.0 fL   MCH 30.8 26.0 - 34.0 pg   MCHC 35.2 32.0 - 36.0 g/dL   RDW 13.0 11.5 - 14.5 %   Platelets 184 150 - 440 K/uL   Neutrophils Relative % 68 %   Neutro Abs 4.9 1.4 - 6.5 K/uL   Lymphocytes Relative 18 %   Lymphs Abs 1.2 1.0 - 3.6 K/uL   Monocytes Relative 9 %   Monocytes Absolute 0.6 0.2 - 1.0 K/uL   Eosinophils Relative 4 %   Eosinophils Absolute 0.3 0 - 0.7 K/uL   Basophils Relative 1 %    Basophils Absolute 0.1 0 - 0.1 K/uL    Diabetic Foot Exam: Diabetic Foot Exam - Simple   Simple  Foot Form  Diabetic Foot exam was performed with the following findings:  Yes 02/07/2016 12:30 PM  Visual Inspection  No deformities, no ulcerations, no other skin breakdown bilaterally:  Yes  Sensation Testing  Intact to touch and monofilament testing bilaterally:  Yes  Pulse Check  Posterior Tibialis and Dorsalis pulse intact bilaterally:  Yes  Comments      PHQ2/9: Depression screen PHQ 2/9 02/07/2016  Decreased Interest 0  Down, Depressed, Hopeless 0  PHQ - 2 Score 0    Fall Risk: Fall Risk  02/07/2016  Falls in the past year? No     Functional Status Survey: Is the patient deaf or have difficulty hearing?: No Does the patient have difficulty seeing, even when wearing glasses/contacts?: No Does the patient have difficulty concentrating, remembering, or making decisions?: No Does the patient have difficulty walking or climbing stairs?: No Does the patient have difficulty dressing or bathing?: No Does the patient have difficulty doing errands alone such as visiting a doctor's office or shopping?: No    Assessment & Plan  1. Type 1 diabetes mellitus without complication (HCC)  - insulin lispro (HUMALOG) 100 UNIT/ML injection; Inject 0.02-0.2 mLs (2-20 Units total) into the skin 4 (four) times daily as needed for high blood sugar.  Dispense: 10 mL; Refill: 0 - Ambulatory referral to Endocrinology - Comprehensive metabolic panel - Hemoglobin A1c  2. Low testosterone  He is not interested in having labs done at this time  3. Depression  - citalopram (CELEXA) 20 MG tablet; Take 1 tablet (20 mg total) by mouth daily.  Dispense: 90 tablet; Refill: 1  4. Change in bowel movements  - CBC with Differential/Platelet  5. Long-term use of high-risk medication  - Comprehensive metabolic panel  6. Weight gain  - TSH  7. Lipid screening  - Lipid panel  8.  Periumbilical pain  - CT Abdomen Pelvis W Contrast; Future - Urine culture - meloxicam (MOBIC) 15 MG tablet; Take 1 tablet (15 mg total) by mouth daily.  Dispense: 30 tablet; Refill: 0

## 2016-02-07 NOTE — Addendum Note (Signed)
Addended by: Alba CorySOWLES, Micai Apolinar F on: 02/07/2016 04:28 PM   Modules accepted: Orders

## 2016-02-08 ENCOUNTER — Telehealth: Payer: Self-pay

## 2016-02-08 LAB — HEMOGLOBIN A1C: HEMOGLOBIN A1C: 6.6 % — AB (ref 4.0–6.0)

## 2016-02-08 NOTE — Telephone Encounter (Signed)
Patient was informed of Dr. Carlynn PurlSowles message below and was told to call if there was no improvements after completing his medication.  Notes Recorded by Alba CoryKrichna Sowles, MD on 02/07/2016 at 5:09 PM Normal CT, patient has been notified, we will treat him for muscular pain, and also UTI ( sending Cipro and also meloxicam to his pharmacy ) , advised to use warm compresses and to call us back if no improvement of symptoms

## 2016-02-09 ENCOUNTER — Other Ambulatory Visit: Payer: Self-pay

## 2016-02-09 LAB — URINE CULTURE: ORGANISM ID, BACTERIA: NO GROWTH

## 2016-02-09 MED ORDER — INSULIN ASPART 100 UNIT/ML ~~LOC~~ SOLN: 2.0000 [IU] | Freq: Three times a day (TID) | SUBCUTANEOUS | Status: DC

## 2016-02-09 NOTE — Telephone Encounter (Signed)
[employer] prefers Novolog to [employer], can we switch it to that medication instead? Novolog 100Units/ML Vial. Thanks

## 2016-02-09 NOTE — Telephone Encounter (Signed)
Refill request was sent to Dr. Krichna Sowles for approval and submission.  

## 2016-03-06 ENCOUNTER — Encounter: Payer: Self-pay | Admitting: Family Medicine

## 2016-03-06 DIAGNOSIS — L84 Corns and callosities: Secondary | ICD-10-CM | POA: Insufficient documentation

## 2016-03-06 LAB — COMPREHENSIVE METABOLIC PANEL
ALT: 25 IU/L (ref 0–44)
AST: 15 IU/L (ref 0–40)
Albumin/Globulin Ratio: 1.6 (ref 1.2–2.2)
Albumin: 4.1 g/dL (ref 3.5–5.5)
Alkaline Phosphatase: 57 IU/L (ref 39–117)
BUN/Creatinine Ratio: 16 (ref 9–20)
BUN: 20 mg/dL (ref 6–24)
Bilirubin Total: 1.4 mg/dL — ABNORMAL HIGH (ref 0.0–1.2)
CO2: 25 mmol/L (ref 18–29)
Calcium: 8.9 mg/dL (ref 8.7–10.2)
Chloride: 97 mmol/L (ref 96–106)
Creatinine, Ser: 1.22 mg/dL (ref 0.76–1.27)
GFR calc Af Amer: 85 mL/min/{1.73_m2} (ref >59)
GFR calc non Af Amer: 74 mL/min/{1.73_m2} (ref >59)
Globulin, Total: 2.6 g/dL (ref 1.5–4.5)
Glucose: 210 mg/dL — ABNORMAL HIGH (ref 65–99)
Potassium: 4.4 mmol/L (ref 3.5–5.2)
Sodium: 138 mmol/L (ref 134–144)
Total Protein: 6.7 g/dL (ref 6.0–8.5)

## 2016-03-06 LAB — CBC WITH DIFFERENTIAL/PLATELET
Basophils Absolute: 0 10*3/uL (ref 0.0–0.2)
Basos: 1 %
EOS (ABSOLUTE): 0.3 10*3/uL (ref 0.0–0.4)
Eos: 5 %
Hematocrit: 47.4 % (ref 37.5–51.0)
Hemoglobin: 16.2 g/dL (ref 12.6–17.7)
Immature Grans (Abs): 0.1 10*3/uL (ref 0.0–0.1)
Immature Granulocytes: 1 %
Lymphocytes Absolute: 1.3 10*3/uL (ref 0.7–3.1)
Lymphs: 21 %
MCH: 30.8 pg (ref 26.6–33.0)
MCHC: 34.2 g/dL (ref 31.5–35.7)
MCV: 90 fL (ref 79–97)
Monocytes Absolute: 0.4 10*3/uL (ref 0.1–0.9)
Monocytes: 7 %
Neutrophils Absolute: 4 10*3/uL (ref 1.4–7.0)
Neutrophils: 65 %
Platelets: 204 10*3/uL (ref 150–379)
RBC: 5.26 x10E6/uL (ref 4.14–5.80)
RDW: 13.6 % (ref 12.3–15.4)
WBC: 6.1 10*3/uL (ref 3.4–10.8)

## 2016-03-06 LAB — LIPID PANEL
Chol/HDL Ratio: 3.8 ratio units (ref 0.0–5.0)
Cholesterol, Total: 179 mg/dL (ref 100–199)
HDL: 47 mg/dL (ref >39)
LDL Calculated: 114 mg/dL — ABNORMAL HIGH (ref 0–99)
Triglycerides: 92 mg/dL (ref 0–149)
VLDL Cholesterol Cal: 18 mg/dL (ref 5–40)

## 2016-03-06 LAB — HEMOGLOBIN A1C
Est. average glucose Bld gHb Est-mCnc: 146 mg/dL
Hgb A1c MFr Bld: 6.7 % — ABNORMAL HIGH (ref 4.8–5.6)

## 2016-03-06 LAB — TSH: TSH: 2.58 u[IU]/mL (ref 0.450–4.500)

## 2017-03-30 ENCOUNTER — Other Ambulatory Visit: Payer: Self-pay | Admitting: Family Medicine

## 2017-03-31 NOTE — Telephone Encounter (Signed)
Pt has switched insurance and states that you are not covered under his new insurance.

## 2017-04-09 ENCOUNTER — Encounter: Payer: BC Managed Care – PPO | Attending: Internal Medicine | Admitting: *Deleted

## 2017-04-09 DIAGNOSIS — E109 Type 1 diabetes mellitus without complications: Secondary | ICD-10-CM

## 2017-04-09 DIAGNOSIS — E1065 Type 1 diabetes mellitus with hyperglycemia: Secondary | ICD-10-CM | POA: Diagnosis present

## 2017-04-09 NOTE — Progress Notes (Signed)
Insulin Pump and / or CGM Evaluation Visit:  Date: 04/09/2017   Appt start time: 1230 end time:  1330.  Assessment:  This patient has DM 1 and their primary concerns today: moving back to Medtronic 670G insulin pump with Auto Mode due to recent hypoglycemia unawareness.  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 Patient states knowledge of Carb Counting is 10 on a scale of 1-10 Patient states they are currently testing BG more than 4 times per day Last A1c was 6.2   Current barriers to managing their diabetes: Patient states:  they have had hypoglycemia multiple times in the past month  MEDICATIONS: Basal Insulin: 16 units of Trsiba at AM via pen  Bolus Insulin: 16 units of Novolog at meals with Carb Ratio of 1/8 grams and Correction of 1/40 over target of 120 mg/dl  via pen  Total Daily Dose of insulin per day 32 Other diabetes medications: none  Progress Towards Obtaining an Insulin Pump Goal(s):   Patient states their expectations of pump therapy include: more stable BG with excellent control Patient expresses understanding that for improved outcomes for their diabetes on an insulin pump they will:  During the first 1-2 weeks (or more) of new pump use, the patient will test BG pre and 2 hour post each meal, bedtime and 3 AM to assist the provider in evaluating accuracy of pump settings and to prevent DKA   Will test BG at least 4 times per day once evaluation time is completed  Change out pump infusion set at least every 3 days  Upload pump information to their pump's Web Based Program on a regular basis so provider can assess patterns and make setting adjustments.     Intervention:    Pt understands difference between delivery of insulin via syringe/pen compared to insulin pump.  Showed patient the following pumps: Medtronic, per his request  Showed patient the following CGM: Medtronic with Auto Mode  Emphasized  importance of follow up after Pump Start for appropriate pump setting adjustments and on-going training on more advanced features.  Handouts given during visit include:  Hand written review of Auto Mode  DM 1/Pump Support Group Flyer  Insulin Pump and /or CGM Packet from Med[employer]onic  Carb Counting handout if needed  Monitoring/Evaluation:    Patient has already started the process of Medtronic 670G insulin pump with Auto Mode.  Once pump / CGM is shipped, patient to call this office to set up training.or we will reach out to [employer] Clinical Manager if she can train sooner as patient needs to get on pump ASAP due to work requirements.

## 2018-03-03 ENCOUNTER — Ambulatory Visit
Admission: RE | Admit: 2018-03-03 | Discharge: 2018-03-03 | Disposition: A | Discharge status: Home / Self Care | Payer: BC Managed Care – PPO | Source: Ambulatory Visit | Attending: Internal Medicine | Admitting: Internal Medicine

## 2018-03-03 ENCOUNTER — Other Ambulatory Visit: Payer: Self-pay | Admitting: Internal Medicine

## 2018-03-03 DIAGNOSIS — M5442 Lumbago with sciatica, left side: Secondary | ICD-10-CM

## 2018-03-20 ENCOUNTER — Other Ambulatory Visit: Payer: Self-pay | Admitting: Internal Medicine

## 2018-03-20 DIAGNOSIS — M5386 Other specified dorsopathies, lumbar region: Secondary | ICD-10-CM

## 2018-03-24 ENCOUNTER — Other Ambulatory Visit: Payer: Self-pay | Admitting: Internal Medicine

## 2018-03-24 DIAGNOSIS — M5386 Other specified dorsopathies, lumbar region: Secondary | ICD-10-CM

## 2019-05-17 IMAGING — CR DG LUMBAR SPINE COMPLETE 4+V
5 series · 5 of 5 positions shown · non-contrast
Comparison: CT scan of February 07, 2016.

CLINICAL DATA: Acute low back and left leg pain without known
injury.

EXAM:
LUMBAR SPINE - COMPLETE 4+ VIEW

[t l-spine a.p.]
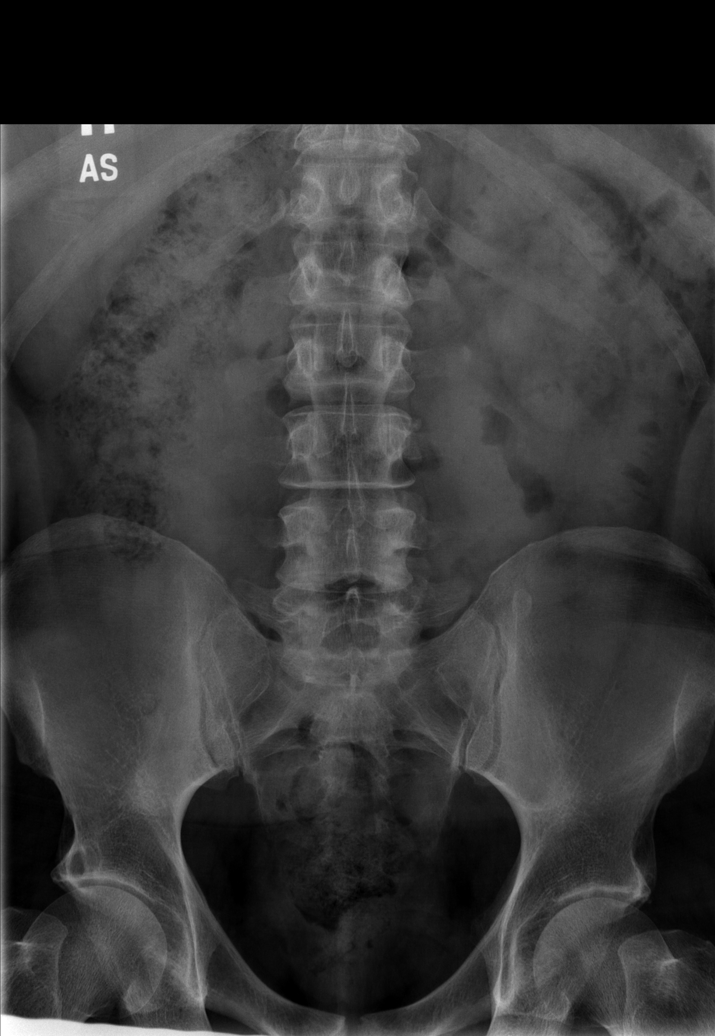

[t l-spine oblique exposure (1 of 2)]
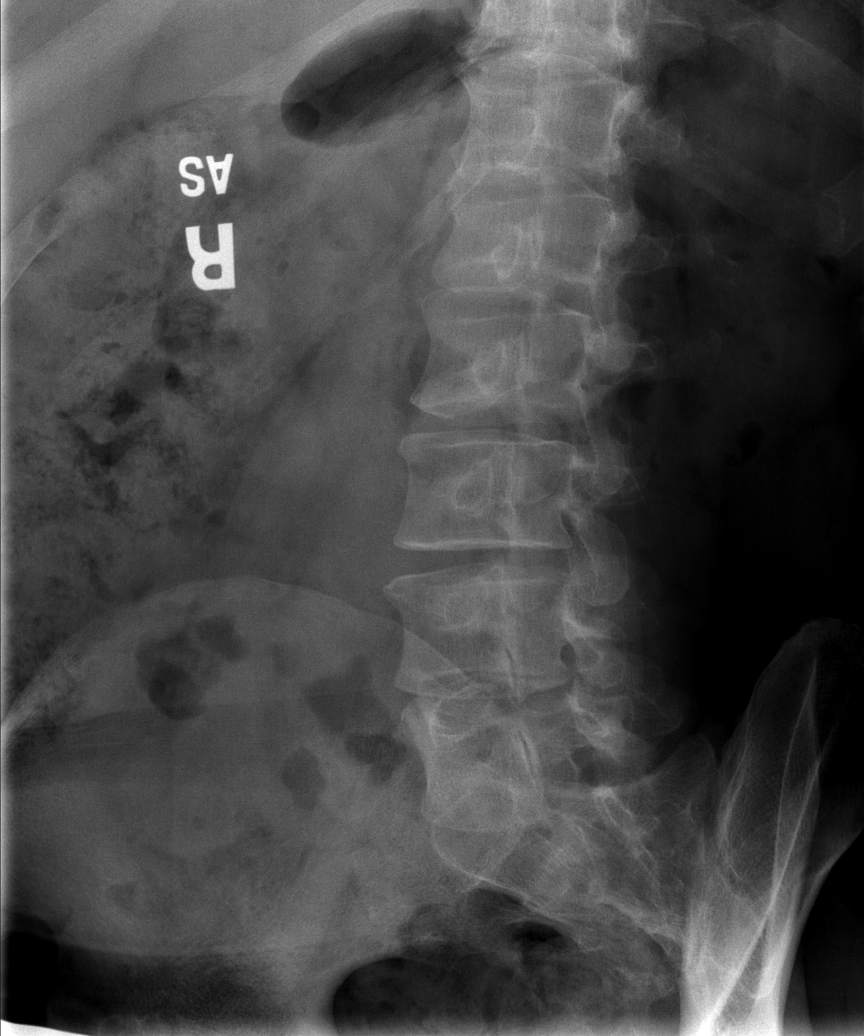

[t l-spine oblique exposure (2 of 2)]
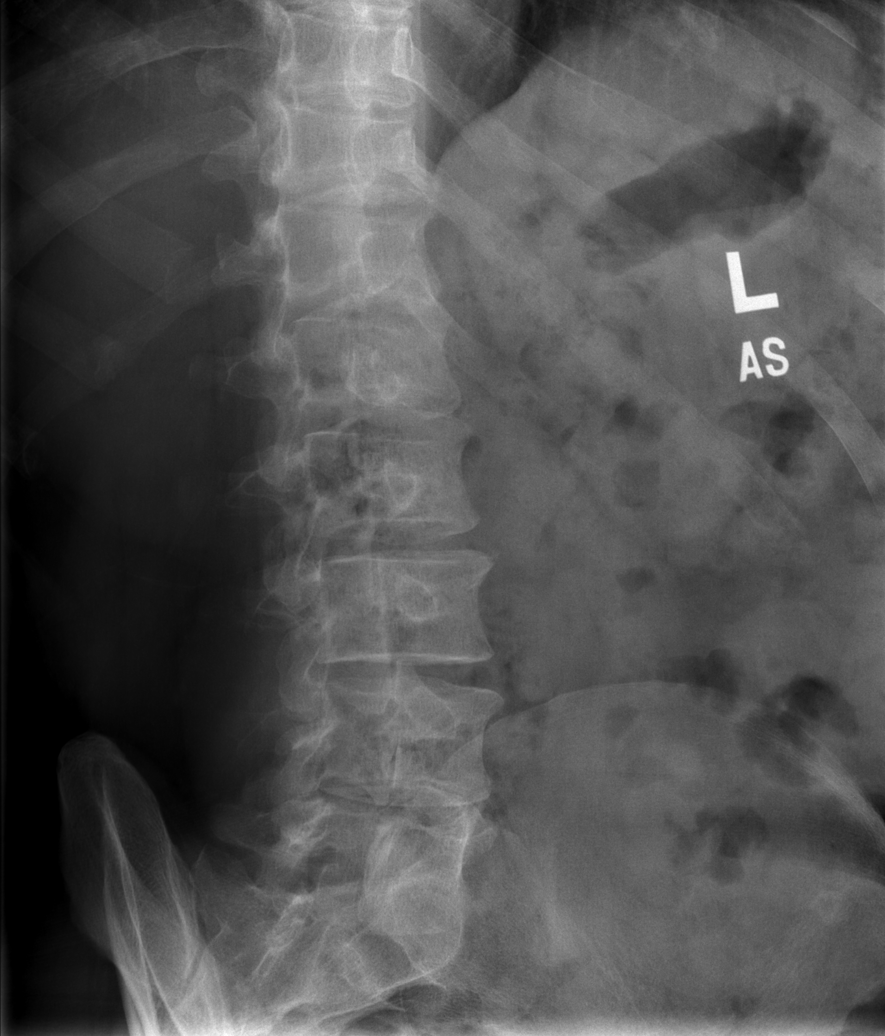

[t l-spine lat]
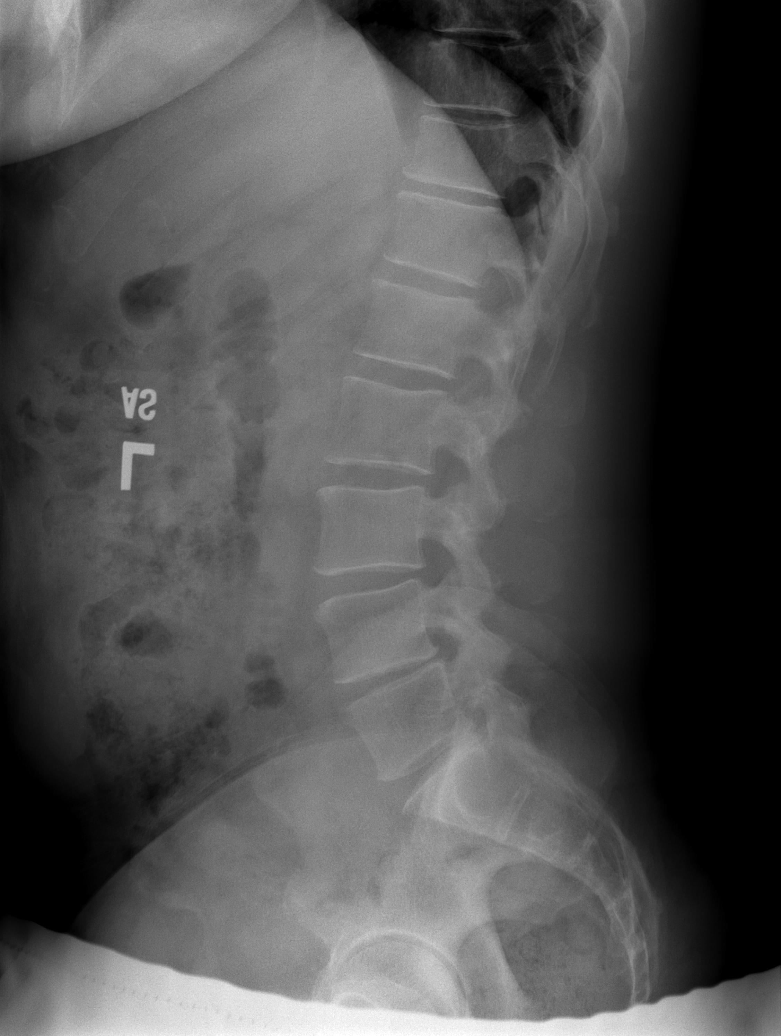

[t l-spine l5-s1 spot]
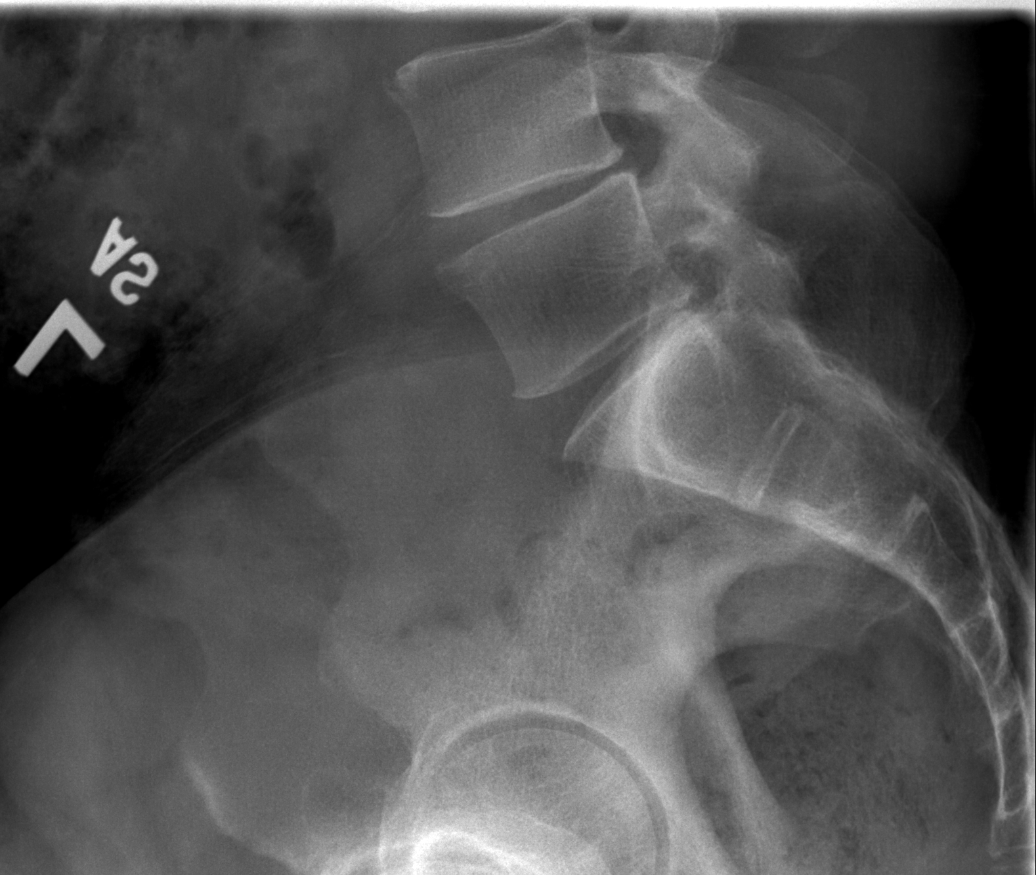

[5 of 5 positions shown; findings below may reference images not displayed]

FINDINGS: No fracture or spondylolisthesis is noted. Moderate degenerative
disc disease is noted at L4-5. Remaining disc spaces appear intact.
IMPRESSION: Moderate degenerative disc disease is noted at L4-5. No acute
abnormality seen in the lumbar spine.

## 2019-09-10 ENCOUNTER — Ambulatory Visit
Admission: EM | Admit: 2019-09-10 | Discharge: 2019-09-10 | Disposition: A | Discharge status: Home / Self Care | Payer: No Typology Code available for payment source | Attending: Family Medicine | Admitting: Family Medicine

## 2019-09-10 ENCOUNTER — Other Ambulatory Visit: Payer: Self-pay

## 2019-09-10 DIAGNOSIS — Z20828 Contact with and (suspected) exposure to other viral communicable diseases: Secondary | ICD-10-CM

## 2019-09-10 DIAGNOSIS — R5383 Other fatigue: Secondary | ICD-10-CM

## 2019-09-10 DIAGNOSIS — Z7189 Other specified counseling: Secondary | ICD-10-CM

## 2019-09-10 DIAGNOSIS — Z20822 Contact with and (suspected) exposure to covid-19: Secondary | ICD-10-CM

## 2019-09-10 DIAGNOSIS — R05 Cough: Secondary | ICD-10-CM | POA: Diagnosis not present

## 2019-09-10 DIAGNOSIS — R0602 Shortness of breath: Secondary | ICD-10-CM | POA: Diagnosis not present

## 2019-09-10 DIAGNOSIS — R058 Other specified cough: Secondary | ICD-10-CM

## 2019-09-10 NOTE — ED Triage Notes (Signed)
Patient complains of fatigue and cough. Reports that he has noticed some Shortness of breath. Symptoms started today. 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

## 2019-09-10 NOTE — Discharge Instructions (Signed)
It was very nice seeing you today in clinic. Thank you for entrusting me with your care.   Rest and Increase fluid intake as much as possible. Water is always best, as sugar and caffeine containing fluids can cause you to become dehydrated. Try to incorporate electrolyte enriched fluids, such as Gatorade or Pedialyte, into your daily fluid intake. May use Tylenol and/or Ibuprofen as needed for pain/fever. Call us if you feel like you are getting worse.   You were tested for SARS-CoV-2 (novel coronavirus) today. Testing is performed by an outside lab ([facility]) and has variable turn around times ranging between 2-5 days. Current recommendations from the the CDC and [place] DHHS require that you remain out of work in order to quarantine at home until negative test results are have been received. In the event that your test results are positive, you will be contacted with further directives. These measures are being implemented out of an abundance of caution to prevent transmission and spread during the current SARS-CoV-2 pandemic.  Make arrangements to follow up with your regular doctor in 1 week for re-evaluation if not improving. If your symptoms/condition worsens, please seek follow up care either here or in the ER. Please remember, our [facility] providers are "right here with you" when you need [place].   Again, it was my pleasure to take care of you today. Thank you for choosing our clinic. I hope that you start to feel better quickly.   Honor Loh, MSN, APRN, FNP-C, CEN Advanced Practice Provider [facility]
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
## 2019-09-12 LAB — NOVEL CORONAVIRUS, NAA (HOSP ORDER, SEND-OUT TO REF LAB; TAT 18-24 HRS): SARS-CoV-2, NAA: NOT DETECTED

## 2019-11-05 ENCOUNTER — Other Ambulatory Visit: Payer: Self-pay

## 2019-11-05 ENCOUNTER — Ambulatory Visit
Admission: EM | Admit: 2019-11-05 | Discharge: 2019-11-05 | Disposition: A | Discharge status: Home / Self Care | Payer: No Typology Code available for payment source | Attending: Family Medicine | Admitting: Family Medicine

## 2019-11-05 DIAGNOSIS — B349 Viral infection, unspecified: Secondary | ICD-10-CM | POA: Diagnosis not present

## 2019-11-05 DIAGNOSIS — R509 Fever, unspecified: Secondary | ICD-10-CM

## 2019-11-05 NOTE — Discharge Instructions (Signed)
Rest, fluids, over the counter medications as needed  

## 2019-11-05 NOTE — ED Provider Notes (Signed)
[facility]    CSN: [id number] Arrival date & time: 11/05/19  1030      History   Chief Complaint Chief Complaint  Patient presents with  . Fever    HPI LUX MEADERS is a 43 y.o. male.   43 yo male with a c/o fevers, cough and congestion since yesterday. Positive covid exposure at work.    Fever   Past Medical History:  Diagnosis Date  . Callus of foot   . Diabetes mellitus without complication ([place])   . High-energy and high-protein diet monitoring encounter   . Low testosterone     Patient Active Problem List   Diagnosis Date Noted  . Callus of foot 03/06/2016  . Low testosterone 02/07/2016  . Depression 02/07/2016  . Type 1 diabetes mellitus ([place]) 10/20/2014    Past Surgical History:  Procedure Laterality Date  . APPENDECTOMY    . VASECTOMY         Home Medications    Prior to Admission medications   Medication Sig Start Date End Date Taking? Authorizing Provider  atorvastatin (LIPITOR) 10 MG tablet Take 10 mg by mouth at bedtime. 07/14/19  Yes [provider]  Blood Glucose Monitoring Suppl (CONTOUR NEXT EZ MONITOR) w/Device KIT  11/30/15  Yes [provider]  glucose blood (BAYER CONTOUR NEXT TEST) test strip  11/30/15  Yes [provider]  HUMALOG 100 UNIT/ML injection inject 2-15 units subcutaneously three times a day before meals 03/31/17  Yes Sowles, Drue Stager, MD  Multiple Vitamin (MULTIVITAMIN) capsule Take by mouth.   Yes [provider]  insulin glargine (LANTUS) 100 UNIT/ML injection Inject into the skin at bedtime.    [provider]  citalopram (CELEXA) 20 MG tablet Take 1 tablet (20 mg total) by mouth daily. 02/07/16 09/10/19  Steele Sizer, MD  insulin aspart (NOVOLOG) 100 UNIT/ML injection Inject 2-15 Units into the skin 3 (three) times daily before meals. 02/09/16 09/10/19  Steele Sizer, MD    Family History Family History  Problem Relation Age of Onset  . Post-traumatic  stress disorder Father        [place] Vet  . Diabetes Father     Social History Social History   Tobacco Use  . Smoking status: Never Smoker  . Smokeless tobacco: Never Used  Substance Use Topics  . Alcohol use: No    Alcohol/week: 0.0 standard drinks  . Drug use: No     Allergies   Patient has no known allergies.   Review of Systems Review of Systems  Constitutional: Positive for fever.     Physical Exam Triage Vital Signs ED Triage Vitals  Enc Vitals Group     BP 11/05/19 1052 (!) 148/88     Pulse Rate 11/05/19 1052 87     Resp 11/05/19 1052 16     Temp 11/05/19 1052 99.3 F (37.4 C)     Temp Source 11/05/19 1052 Oral     SpO2 11/05/19 1052 96 %     Weight 11/05/19 1051 200 lb (90.7 kg)     Height 11/05/19 1051 5' 3"  (1.6 m)     Head Circumference --      Peak Flow --      Pain Score 11/05/19 1050 6     Pain Loc --      Pain Edu? --      Excl. in [place]? --    No data found.  Updated Vital Signs BP (!) 148/88 (BP Location: Left  Arm)   Pulse 87   Temp 99.3 F (37.4 C) (Oral)   Resp 16   Ht 5' 3"  (1.6 m)   Wt 90.7 kg   SpO2 96%   BMI 35.43 kg/m   Visual Acuity Right Eye Distance:   Left Eye Distance:   Bilateral Distance:    Right Eye Near:   Left Eye Near:    Bilateral Near:     Physical Exam Vitals and nursing note reviewed.  Constitutional:      General: He is not in acute distress.    Appearance: He is not toxic-appearing or diaphoretic.  Cardiovascular:     Rate and Rhythm: Normal rate.  Pulmonary:     Effort: Pulmonary effort is normal. No respiratory distress.     Breath sounds: Normal breath sounds. No stridor. No wheezing, rhonchi or rales.  Neurological:     Mental Status: He is alert.      UC Treatments / Results  Labs (all labs ordered are listed, but only abnormal results are displayed) Labs Reviewed  NOVEL CORONAVIRUS, NAA (HOSP ORDER, SEND-OUT TO REF LAB; TAT 18-24 HRS)    EKG   Radiology No results  found.  Procedures Procedures (including critical care time)  Medications Ordered in UC Medications - No data to display  Initial Impression / Assessment and Plan / UC Course  I have reviewed the triage vital signs and the nursing notes.  Pertinent labs & imaging results that were available during my care of the patient were reviewed by me and considered in my medical decision making (see chart for details).      Final Clinical Impressions(s) / UC Diagnoses   Final diagnoses:  Viral syndrome     Discharge Instructions     Rest, fluids, over the counter medications as needed    ED Prescriptions    None      1. Labs/x-ray results and diagnosis reviewed with patient 2. Recommend supportive treatment as above 3. Follow-up prn PDMP not reviewed this encounter.   Norval Gable, MD 11/05/19 1400

## 2019-11-05 NOTE — ED Triage Notes (Signed)
Patient states that he has been having fever since yesterday with a cough and congestion. States that he has multiple exposures through colleagues.

## 2019-11-06 ENCOUNTER — Telehealth: Payer: Self-pay | Admitting: Emergency Medicine

## 2019-11-06 NOTE — Telephone Encounter (Signed)
Patient called in requesting covid test results. Advised test is positive. Continue to quarantine 10 days from the start of symptoms and fever free for 3 days without fever reducer and no new respirator symptoms. Patient agreed and voiced understanding.

## 2019-11-08 LAB — NOVEL CORONAVIRUS, NAA (HOSP ORDER, SEND-OUT TO REF LAB; TAT 18-24 HRS): SARS-CoV-2, NAA: DETECTED — AB

## 2019-12-25 ENCOUNTER — Emergency Department
Admission: EM | Admit: 2019-12-25 | Discharge: 2019-12-25 | Disposition: A | Discharge status: Home / Self Care | Payer: No Typology Code available for payment source

## 2019-12-25 ENCOUNTER — Encounter: Payer: Self-pay | Admitting: Emergency Medicine

## 2019-12-25 ENCOUNTER — Other Ambulatory Visit: Payer: Self-pay

## 2020-02-18 ENCOUNTER — Emergency Department
Admission: EM | Admit: 2020-02-18 | Discharge: 2020-02-18 | Disposition: A | Discharge status: Home / Self Care | Payer: PRIVATE HEALTH INSURANCE | Attending: Emergency Medicine | Admitting: Emergency Medicine

## 2020-02-18 ENCOUNTER — Other Ambulatory Visit: Payer: Self-pay

## 2020-02-18 ENCOUNTER — Encounter: Payer: Self-pay | Admitting: Emergency Medicine

## 2020-02-18 DIAGNOSIS — E162 Hypoglycemia, unspecified: Secondary | ICD-10-CM | POA: Diagnosis present

## 2020-02-18 DIAGNOSIS — Z79899 Other long term (current) drug therapy: Secondary | ICD-10-CM | POA: Diagnosis not present

## 2020-02-18 DIAGNOSIS — E10649 Type 1 diabetes mellitus with hypoglycemia without coma: Secondary | ICD-10-CM | POA: Diagnosis not present

## 2020-02-18 LAB — URINALYSIS, COMPLETE (UACMP) WITH MICROSCOPIC
Bacteria, UA: NONE SEEN
Bilirubin Urine: NEGATIVE
Glucose, UA: NEGATIVE mg/dL
Hgb urine dipstick: NEGATIVE
Ketones, ur: NEGATIVE mg/dL
Leukocytes,Ua: NEGATIVE
Nitrite: NEGATIVE
Protein, ur: NEGATIVE mg/dL
Specific Gravity, Urine: 1.012 (ref 1.005–1.030)
Squamous Epithelial / LPF: NONE SEEN (ref 0–5)
pH: 6 (ref 5.0–8.0)

## 2020-02-18 LAB — BASIC METABOLIC PANEL
Anion gap: 8 (ref 5–15)
BUN: 19 mg/dL (ref 6–20)
CO2: 31 mmol/L (ref 22–32)
Calcium: 8.5 mg/dL — ABNORMAL LOW (ref 8.9–10.3)
Chloride: 100 mmol/L (ref 98–111)
Creatinine, Ser: 1.13 mg/dL (ref 0.61–1.24)
GFR calc Af Amer: >60 mL/min (ref >60)
GFR calc non Af Amer: >60 mL/min (ref >60)
Glucose, Bld: 91 mg/dL (ref 70–99)
Potassium: 3.6 mmol/L (ref 3.5–5.1)
Sodium: 139 mmol/L (ref 135–145)

## 2020-02-18 LAB — CBC WITH DIFFERENTIAL/PLATELET
Abs Immature Granulocytes: 0.11 10*3/uL — ABNORMAL HIGH (ref 0.00–0.07)
Basophils Absolute: 0.1 10*3/uL (ref 0.0–0.1)
Basophils Relative: 1 %
Eosinophils Absolute: 0.2 10*3/uL (ref 0.0–0.5)
Eosinophils Relative: 1 %
HCT: 42.4 % (ref 39.0–52.0)
Hemoglobin: 14.9 g/dL (ref 13.0–17.0)
Immature Granulocytes: 1 %
Lymphocytes Relative: 11 %
Lymphs Abs: 1.4 10*3/uL (ref 0.7–4.0)
MCH: 31.2 pg (ref 26.0–34.0)
MCHC: 35.1 g/dL (ref 30.0–36.0)
MCV: 88.9 fL (ref 80.0–100.0)
Monocytes Absolute: 0.9 10*3/uL (ref 0.1–1.0)
Monocytes Relative: 7 %
Neutro Abs: 9.8 10*3/uL — ABNORMAL HIGH (ref 1.7–7.7)
Neutrophils Relative %: 79 %
Platelets: 208 10*3/uL (ref 150–400)
RBC: 4.77 MIL/uL (ref 4.22–5.81)
RDW: 12.1 % (ref 11.5–15.5)
WBC: 12.5 10*3/uL — ABNORMAL HIGH (ref 4.0–10.5)
nRBC: 0 % (ref 0.0–0.2)

## 2020-02-18 LAB — GLUCOSE, CAPILLARY: Glucose-Capillary: 92 mg/dL (ref 70–99)

## 2020-02-18 NOTE — Discharge Instructions (Addendum)
Return to the ER for new, worsening, persistent low blood glucose, weakness or lightheadedness, or any other new or worsening symptoms that concern you.  Follow-up with your endocrinologist and primary care doctor.
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
## 2020-02-18 NOTE — ED Notes (Signed)
Pt denies feeling any different recently or having any n/v/d or sickness but just that his sugar has dropped. Pt states he last ate around 3 pm.

## 2020-02-18 NOTE — ED Notes (Signed)
Pt given [place] sandwich tray and more juice.
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
## 2022-01-28 NOTE — Progress Notes (Deleted)
Psychiatric Initial Adult Assessment  ? ?Patient Identification: Carlos Wilkins ?MRN:  [id number] ?Date of Evaluation:  01/28/2022 ?Referral Source: *** ?Chief Complaint:  No chief complaint on file. ? ?Visit Diagnosis: No diagnosis found. ? ?History of Present Illness:   ?Carlos Wilkins is a 46 y.o. year old male with a history of type I diabetes, who is referred for inattention.  ? ? ? ?Used to see Dr. Nicolasa Ducking ? ? ?Daily routine: ?Diet:  ?Exercise: ?Support: ?Household:  ?Marital status: married ?Number of children: 2  ?Employment: Engineer, structural ?Education:  high school ?Last PCP / ongoing medical evaluation:   ? ?Associated Signs/Symptoms: ?Depression Symptoms:  {DEPRESSION SYMPTOMS:20000} ?(Hypo) Manic Symptoms:  {BHH MANIC SYMPTOMS:22872} ?Anxiety Symptoms:  {BHH ANXIETY SYMPTOMS:22873} ?Psychotic Symptoms:  {BHH PSYCHOTIC SYMPTOMS:22874} ?PTSD Symptoms: ?{BHH PTSD SYMPTOMS:22875} ? ?Past Psychiatric History:  ?Outpatient:  ?Psychiatry admission:  ?Previous suicide attempt:  ?Past trials of medication:  ?History of violence:   ? ?Previous Psychotropic Medications: {YES/NO:21197} ? ?Substance Abuse History in the last 12 months:  {yes no:314532} ? ?Consequences of Substance Abuse: ?{BHH CONSEQUENCES OF SUBSTANCE ABUSE:22880} ? ?Past Medical History:  ?Past Medical History:  ?Diagnosis Date  ? Callus of foot   ? Diabetes mellitus without complication ([place])   ? High-energy and high-protein diet monitoring encounter   ? Low testosterone   ?  ?Past Surgical History:  ?Procedure Laterality Date  ? APPENDECTOMY    ? VASECTOMY    ? ? ?Family Psychiatric History: *** ? ?Family History:  ?Family History  ?Problem Relation Age of Onset  ? Post-traumatic stress disorder Father   ?     [place] Vet  ? Diabetes Father   ? ? ?Social History:   ?Social History  ? ?Socioeconomic History  ? Marital status: Married  ?  Spouse name: Not on file  ? Number of children: Not on file  ? Years of education: Not on file  ? Highest  education level: Not on file  ?Occupational History  ? Not on file  ?Tobacco Use  ? Smoking status: Never  ? Smokeless tobacco: Never  ?Vaping Use  ? Vaping Use: Never used  ?Substance and Sexual Activity  ? Alcohol use: No  ?  Alcohol/week: 0.0 standard drinks  ? Drug use: No  ? Sexual activity: Yes  ?  Partners: Female  ?Other Topics Concern  ? Not on file  ?Social History Narrative  ? Not on file  ? ?Social Determinants of Health  ? ?Financial Resource Strain: Not on file  ?Food Insecurity: Not on file  ?Transportation Needs: Not on file  ?Physical Activity: Not on file  ?Stress: Not on file  ?Social Connections: Not on file  ? ? ?Additional Social History: *** ? ?Allergies:  No Known Allergies ? ?Metabolic Disorder Labs: ?Lab Results  ?Component Value Date  ? HGBA1C 6.7 (H) 03/05/2016  ? ?No results found for: PROLACTIN ?Lab Results  ?Component Value Date  ? CHOL 179 03/05/2016  ? TRIG 92 03/05/2016  ? HDL 47 03/05/2016  ? CHOLHDL 3.8 03/05/2016  ? VLDL 22 02/07/2016  ? LDLCALC 114 (H) 03/05/2016  ? LDLCALC 111 (H) 02/07/2016  ? ?Lab Results  ?Component Value Date  ? TSH 2.580 03/05/2016  ? ? ?Therapeutic Level Labs: ?No results found for: LITHIUM ?No results found for: CBMZ ?No results found for: VALPROATE ? ?Current Medications: ?Current Outpatient Medications  ?Medication Sig Dispense Refill  ? atorvastatin (LIPITOR) 10 MG tablet Take 10 mg by mouth at bedtime.    ?  Blood Glucose Monitoring Suppl (CONTOUR NEXT EZ MONITOR) w/Device KIT     ? glucose blood (BAYER CONTOUR NEXT TEST) test strip     ? HUMALOG 100 UNIT/ML injection inject 2-15 units subcutaneously three times a day before meals 10 mL 0  ? insulin glargine (LANTUS) 100 UNIT/ML injection Inject into the skin at bedtime.    ? Multiple Vitamin (MULTIVITAMIN) capsule Take by mouth.    ? ?No current facility-administered medications for this visit.  ? ? ?Musculoskeletal: ?Strength & Muscle Tone:  N/A ?Gait & Station:  N/A ?Patient leans:  N/A ? ?Psychiatric Specialty Exam: ?Review of Systems  ?There were no vitals taken for this visit.There is no height or weight on file to calculate BMI.  ?General Appearance: {Appearance:22683}  ?Eye Contact:  {BHH EYE CONTACT:22684}  ?Speech:  Clear and Coherent  ?Volume:  Normal  ?Mood:  {BHH MOOD:22306}  ?Affect:  {Affect (PAA):22687}  ?Thought Process:  Coherent  ?Orientation:  Full (Time, Place, and Person)  ?Thought Content:  Logical  ?Suicidal Thoughts:  {ST/HT (PAA):22692}  ?Homicidal Thoughts:  {ST/HT (PAA):22692}  ?Memory:  Immediate;   Good  ?Judgement:  {Judgement (PAA):22694}  ?Insight:  {Insight (PAA):22695}  ?Psychomotor Activity:  Normal  ?Concentration:  Concentration: Good and Attention Span: Good  ?Recall:  Good  ?[place] of [place]  ?Language: Good  ?Akathisia:  No  ?Handed:  Right  ?AIMS (if indicated):  not done  ?Assets:  Communication Skills ?Desire for Improvement  ?ADL's:  Intact  ?Cognition: WNL  ?Sleep:  {BHH GOOD/FAIR/POOR:22877}  ? ?Screenings: ?PHQ2-9   ? ?Flowsheet Row Nutrition from 04/09/2017 in [facility] Visit from 02/07/2016 in [facility]  ?PHQ-2 Total Score 0 0  ? ?  ? ? ?Assessment and Plan:  ? ? ?Plan ? ? ?The patient demonstrates the following risk factors for suicide: Chronic risk factors for suicide include: {Chronic Risk Factors for OIBBCWU:88916945}. Acute risk factors for suicide include: {Acute Risk Factors for WTUUEKC:00349179}. Protective factors for this patient include: {Protective Factors for Suicide XTAV:69794801}. Considering these factors, the overall suicide risk at this point appears to be {Desc; low/moderate/high:110033}. Patient {ACTION; IS/IS KPV:37482707} appropriate for outpatient follow up.  ? ? ?  ? ?Collaboration of Care: {BH OP Collaboration of Care:21014065} ? ?Patient/Guardian was advised Release of Information must be obtained prior to any record release in order to collaborate their care  with an outside provider. Patient/Guardian was advised if they have not already done so to contact the registration department to sign all necessary forms in order for [place] to release information regarding their care.  ? ?Consent: Patient/Guardian gives verbal consent for treatment and assignment of benefits for services provided during this visit. Patient/Guardian expressed understanding and agreed to proceed.  ? ?Norman Clay, MD ?3/13/20239:34 AM ? ?

## 2022-01-30 ENCOUNTER — Telehealth: Payer: Self-pay | Admitting: Psychiatry

## 2022-02-04 NOTE — Progress Notes (Signed)
Virtual Visit via Video Note ? ?I connected with Carlos Wilkins on 02/06/22 at  2:00 PM EDT by a video enabled telemedicine application and verified that I am speaking with the correct person using two identifiers. ? ?Location: ?Patient: work ?Provider: office ?Persons participated in the visit- patient, provider  ?  ?I discussed the limitations of evaluation and management by telemedicine and the availability of in person appointments. The patient expressed understanding and agreed to proceed. ?  ?I discussed the assessment and treatment plan with the patient. The patient was provided an opportunity to ask questions and all were answered. The patient agreed with the plan and demonstrated an understanding of the instructions. ?  ?The patient was advised to call back or seek an in-person evaluation if the symptoms worsen or if the condition fails to improve as anticipated. ? ?I provided 40 minutes of non-face-to-face time during this encounter. ? ? ?Carlos Clay, MD ? ? ? ? ? ? ? ? Psychiatric Initial Adult Assessment  ? ?Patient Identification: Carlos Wilkins ?MRN:  [id number] ?Date of Evaluation:  02/06/2022 ?Referral Source: Dr. Nicolasa Ducking ?Chief Complaint:   ?Chief Complaint  ?Patient presents with  ? Follow-up  ? Depression  ? ?Visit Diagnosis:  ?  ICD-10-CM   ?1. MDD (major depressive disorder), recurrent episode, moderate (HCC)  F33.1   ?  ?2. Attention deficit hyperactivity disorder (ADHD), unspecified ADHD type  F90.9   ?  ? ? ?History of Present Illness:   ?Carlos Wilkins is a 46 y.o. year old male with a history of ADHD, type I diabetes (since age 89), who is referred for depression, ADHD.  ? ?He states that he used to be seen by Dr. Nicolasa Ducking for ADHD.  Although he was on sertraline and methylphenidate, he discontinued these medications as his wife was concerned of its potential side effect.  Although he thought he was initially doing better, he notices that he has not been able to organize or get things done.  He  tried to restart both of medication about a month ago; he had significant anxiety, palpitation and hypertension.  He has not taken both medication since then.  He would like to restart his medication.  He is trying to get things accomplished.  He works as a Engineer, structural, and teaches at [employer].  He loves his work.  Although he did get involved in the scene such as a toddler boy died from choking, he thinks he has been doing fine without PTSD symptoms. He and his wife have seen a couples therapist.  They have been working on communication. He likes it a lot so far.  He also states that they are planning to go to a grief program given his wife suffers from grief secondary to loss of her parent.  He reports fair relationship with his 2 children from prior marriage.  He enjoys seeing his grandson.  He occasionally contacts with his parents.  Although he reports good relationship with his mother, he has limited connection with his father due to differences in values and ethics.  ? ?Depression- he was never treated for depression until last year, although he struggled with down mood in the past.  He started to see Dr. Nicolasa Ducking as his wife noticed that he had lack of motivation, and was unable to get tasks done.  He has not been able to work out, although he has just started to do so lately.  Although he denies feeling depressed, he feels down.  He has other depressive symptoms as in PHQ-9.  ? ?ADHD-he was diagnosed with ADHD last year after taking test.  It has been difficult for him to get tasks done, stating that he has scattered brain.  Although he has tried to use sticky note to organize, he struggles at work.  ? ?Substance- he denies alcohol use or drug use.  ? ?Medication- sertraline 100 mg daily, methylphenidate hcl 36 mg daily ? 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  He reports good relationship with his mother, who is always supportive.  He states that his father was never at home, and was superficial even when he may come to certain sports events.  He describes his father as racists.  Although he made contact with him, they do not hang out together.  ? ?Associated Signs/Symptoms: ?Depression Symptoms:  depressed mood, ?anhedonia, ?insomnia, ?fatigue, ?difficulty concentrating, ?anxiety, ?(Hypo) Manic Symptoms:   denies decreased need for sleep, euphoria ?Anxiety Symptoms:   mild anxiety ?Psychotic Symptoms:   denies AH, VH, paranoia ?PTSD Symptoms: ?Negative ? ? ?Past Psychiatric History:  ?Outpatient: Dr. Nicolasa Ducking for ADHD ?Psychiatry admission: denies ?Previous suicide attempt: denies ?Past trials of medication: sertraline, bupropion (concerta), methylphenidate ?History of violence:  denies ? ?Previous Psychotropic Medications: Yes  ? ?Substance Abuse History in the last 12 months:  No. ? ?Consequences of Substance Abuse: ?NA ? ?Past Medical History:  ?Past Medical History:  ?Diagnosis Date  ? Callus of foot   ? Diabetes mellitus without complication ([place])   ? High-energy and high-protein diet monitoring encounter   ? Low testosterone   ?  ?Past Surgical History:  ?Procedure Laterality Date  ? APPENDECTOMY    ? VASECTOMY    ? ? ?Family Psychiatric History: denies ? ?Family History:  ?Family History  ?Problem Relation Age of Onset  ? Post-traumatic stress disorder Father   ?     [place] Vet  ? Diabetes Father   ? ? ?Social History:   ?Social History  ? ?Socioeconomic History  ? Marital status: Married  ?  Spouse name: Not on file  ? Number of children: Not on file  ? Years of education: Not on file  ? Highest education level: Not on file  ?Occupational History  ? Not on file  ?Tobacco Use  ? Smoking status: Never  ? Smokeless tobacco: Never  ?Vaping Use  ? Vaping  Use: Never used  ?Substance and Sexual Activity  ? Alcohol use: No  ?  Alcohol/week: 0.0 standard drinks  ? Drug use: No  ? Sexual activity: Yes  ?  Partners: Female  ?Other Topics Concern  ? Not on file  ?Social History Narrative  ? Not on file  ? ?Social Determinants of Health  ? ?Financial Resource Strain: Not on file  ?Food Insecurity: Not on file  ?Transportation Needs: Not on file  ?Physical Activity: Not on file  ?Stress: Not on file  ?Social Connections: Not on file  ? ? ?Additional Social History: as above ? ?Allergies:  No Known Allergies ? ?Metabolic Disorder Labs: ?Lab Results  ?Component Value Date  ? HGBA1C 6.7 (H) 03/05/2016  ? ?No results found for: PROLACTIN ?Lab Results  ?Component Value Date  ? CHOL 179 03/05/2016  ? TRIG 92 03/05/2016  ?  HDL 47 03/05/2016  ? CHOLHDL 3.8 03/05/2016  ? VLDL 22 02/07/2016  ? LDLCALC 114 (H) 03/05/2016  ? LDLCALC 111 (H) 02/07/2016  ? ?Lab Results  ?Component Value Date  ? TSH 2.580 03/05/2016  ? ? ?Therapeutic Level Labs: ?No results found for: LITHIUM ?No results found for: CBMZ ?No results found for: VALPROATE ? ?Current Medications: ?Current Outpatient Medications  ?Medication Sig Dispense Refill  ? sertraline (ZOLOFT) 50 MG tablet 25 mg at night for one week, then 50 mg at night 30 tablet 1  ? atorvastatin (LIPITOR) 10 MG tablet Take 10 mg by mouth at bedtime.    ? Blood Glucose Monitoring Suppl (CONTOUR NEXT EZ MONITOR) w/Device KIT     ? glucose blood (BAYER CONTOUR NEXT TEST) test strip     ? HUMALOG 100 UNIT/ML injection inject 2-15 units subcutaneously three times a day before meals 10 mL 0  ? insulin glargine (LANTUS) 100 UNIT/ML injection Inject into the skin at bedtime.    ? Multiple Vitamin (MULTIVITAMIN) capsule Take by mouth.    ? ?No current facility-administered medications for this visit.  ? ? ?Musculoskeletal: ?Strength & Muscle Tone:  N/A ?Gait & Station:  N/A ?Patient leans: N/A ? ?Psychiatric Specialty Exam: ?Review of Systems   ?Psychiatric/Behavioral:  Positive for decreased concentration, dysphoric mood and sleep disturbance. Negative for agitation, behavioral problems, confusion, hallucinations, self-injury and suicidal ideas. The patient

## 2022-02-06 ENCOUNTER — Telehealth (INDEPENDENT_AMBULATORY_CARE_PROVIDER_SITE_OTHER): Payer: PRIVATE HEALTH INSURANCE | Admitting: Psychiatry

## 2022-02-06 ENCOUNTER — Other Ambulatory Visit: Payer: Self-pay

## 2022-02-06 ENCOUNTER — Encounter: Payer: Self-pay | Admitting: Psychiatry

## 2022-02-06 DIAGNOSIS — F909 Attention-deficit hyperactivity disorder, unspecified type: Secondary | ICD-10-CM

## 2022-02-06 DIAGNOSIS — F331 Major depressive disorder, recurrent, moderate: Secondary | ICD-10-CM | POA: Diagnosis not present

## 2022-02-06 MED ORDER — SERTRALINE HCL 50 MG PO TABS: ORAL_TABLET | ORAL | 1 refills | Status: DC

## 2022-02-06 NOTE — Patient Instructions (Signed)
Start sertraline 25 mg daily for one week, then 50 mg daily  ?Hold methylphenidate ?Next appointment: 5/3 at 10:30  ?

## 2022-03-18 NOTE — Progress Notes (Signed)
Virtual Visit via Video Note ? ?I connected with Carlos Wilkins on 03/20/22 at 10:30 AM EDT by a video enabled telemedicine application and verified that I am speaking with the correct person using two identifiers. ? ?Location: ?Patient: work ?Provider: office ?Persons participated in the visit- patient, provider  ?  ?I discussed the limitations of evaluation and management by telemedicine and the availability of in person appointments. The patient expressed understanding and agreed to proceed. ? ?  ?I discussed the assessment and treatment plan with the patient. The patient was provided an opportunity to ask questions and all were answered. The patient agreed with the plan and demonstrated an understanding of the instructions. ?  ?The patient was advised to call back or seek an in-person evaluation if the symptoms worsen or if the condition fails to improve as anticipated. ? ?I provided 15 minutes of non-face-to-face time during this encounter. ? ? ?Norman Clay, MD ? ? ? ?BH MD/PA/NP OP Progress Note ? ?03/20/2022 11:03 AM ?Carlos Wilkins  ?MRN:  [id number] ? ?Chief Complaint:  ?Chief Complaint  ?Patient presents with  ? Follow-up  ? Depression  ? ?HPI:  ?This is a follow-up appointment for depression.  ?He states that he is doing "fine."  He feels he has scattered brain.  He cannot keep up with things, and there are so much things going on.  He feels like chasing a squirrel.  He has been busy at work/school.  Although he tries to do something, he is unable to complete things.  He has difficulty with focus.  The relationship with his wife has been good.  They have been working on communication with marriage counselor.  His mood is "fine." He denies feeling down or anhedonia.  He states that he does not think he had those symptoms prior to starting sertraline.  He has initial and middle insomnia, which she attributes to thinking about what comes next.  He denies change in appetite or weight.  He denies SI.  He  denies anxiety.  He would like to start medication for his inattention.  ? 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 ? ? ?Visit Diagnosis:  ?  ICD-10-CM   ?1. MDD (major depressive disorder), recurrent, in partial remission ([place])  F33.41   ?  ?2. Attention deficit hyperactivity disorder (ADHD), unspecified ADHD type  F90.9   ?  ? ? ?Past Psychiatric History: Please see initial evaluation for full details. I have reviewed the history. No updates at this time.  ?  ? ?Past Medical History:  ?Past Medical History:  ?Diagnosis Date  ? Callus of foot   ? Diabetes mellitus without complication ([place])   ? High-energy and high-protein diet monitoring encounter   ? Low testosterone   ?  ?Past Surgical History:  ?Procedure Laterality Date  ? APPENDECTOMY    ? VASECTOMY    ? ? ?Family Psychiatric History: Please see initial evaluation for full details. I have reviewed the history. No updates at this time.  ?  ? ?Family History:  ?Family History  ?Problem Relation Age of Onset  ? Post-traumatic stress disorder Father   ?     [place] Vet  ? Diabetes Father   ? ? ?Social History:  ?Social History  ? ?Socioeconomic History  ? Marital  status: Married  ?  Spouse name: Not on file  ? Number of children: Not on file  ? Years of education: Not on file  ? Highest education level: Not on file  ?Occupational History  ? Not on file  ?Tobacco Use  ? Smoking status: Never  ? Smokeless tobacco: Never  ?Vaping Use  ? Vaping Use: Never used  ?Substance and Sexual Activity  ? Alcohol use: No  ?  Alcohol/week: 0.0 standard drinks  ? Drug use: No  ? Sexual activity: Yes  ?  Partners: Female  ?Other Topics Concern  ? Not on file  ?Social History Narrative  ? Not on file  ? ?Social Determinants of Health  ? ?Financial Resource  Strain: Not on file  ?Food Insecurity: Not on file  ?Transportation Needs: Not on file  ?Physical Activity: Not on file  ?Stress: Not on file  ?Social Connections: Not on file  ? ? ?Allergies: No Known Allergies ? ?Metabolic Disorder Labs: ?Lab Results  ?Component Value Date  ? HGBA1C 6.7 (H) 03/05/2016  ? ?No results found for: PROLACTIN ?Lab Results  ?Component Value Date  ? CHOL 179 03/05/2016  ? TRIG 92 03/05/2016  ? HDL 47 03/05/2016  ? CHOLHDL 3.8 03/05/2016  ? VLDL 22 02/07/2016  ? LDLCALC 114 (H) 03/05/2016  ? LDLCALC 111 (H) 02/07/2016  ? ?Lab Results  ?Component Value Date  ? TSH 2.580 03/05/2016  ? TSH 3.292 02/07/2016  ? ? ?Therapeutic Level Labs: ?No results found for: LITHIUM ?No results found for: VALPROATE ?No components found for:  CBMZ ? ?Current Medications: ?Current Outpatient Medications  ?Medication Sig Dispense Refill  ? methylphenidate (CONCERTA) 27 MG PO CR tablet Take 1 tablet (27 mg total) by mouth daily before breakfast. 30 tablet 0  ? [START ON 04/19/2022] methylphenidate (CONCERTA) 27 MG PO CR tablet Take 1 tablet (27 mg total) by mouth daily before breakfast. 30 tablet 0  ? atorvastatin (LIPITOR) 10 MG tablet Take 10 mg by mouth at bedtime.    ? Blood Glucose Monitoring Suppl (CONTOUR NEXT EZ MONITOR) w/Device KIT     ? glucose blood (BAYER CONTOUR NEXT TEST) test strip     ? HUMALOG 100 UNIT/ML injection inject 2-15 units subcutaneously three times a day before meals 10 mL 0  ? insulin glargine (LANTUS) 100 UNIT/ML injection Inject into the skin at bedtime.    ? Multiple Vitamin (MULTIVITAMIN) capsule Take by mouth.    ? [START ON 04/08/2022] sertraline (ZOLOFT) 50 MG tablet Take 1 tablet (50 mg total) by mouth daily. 30 tablet 0  ? ?No current facility-administered medications for this visit.  ? ? ? ?Musculoskeletal: ?Strength & Muscle Tone:  N/A ?Gait & Station:  N/A ?Patient leans: N/A ? ?Psychiatric Specialty Exam: ?Review of Systems  ?Psychiatric/Behavioral:  Positive for decreased  concentration and sleep disturbance. Negative for agitation, behavioral problems, confusion, dysphoric mood, hallucinations, self-injury and suicidal ideas. The patient is not nervous/anxious and is not hyperactive.   ?All other systems reviewed and are negative.  ?There were no vitals taken for this visit.There is no height or weight on file to calculate BMI.  ?General Appearance: Fairly Groomed  ?Eye Contact:  Good  ?Speech:  Clear and Coherent  ?Volume:  Normal  ?Mood:   fine  ?Affect:  Appropriate, Congruent, and Restricted  ?Thought Process:  Coherent  ?Orientation:  Full (Time, Place, and Person)  ?Thought Content: Logical   ?Suicidal Thoughts:  No  ?Homicidal Thoughts:  No  ?  Memory:  Immediate;   Good  ?Judgement:  Good  ?Insight:  Good  ?Psychomotor Activity:  Normal  ?Concentration:  Concentration: Good and Attention Span: Good  ?Recall:  Good  ?Fund of Knowledge: Good  ?Language: Good  ?Akathisia:  No  ?Handed:  Right  ?AIMS (if indicated): not done  ?Assets:  Communication Skills ?Desire for Improvement  ?ADL's:  Intact  ?Cognition: WNL  ?Sleep:  Poor  ? ?Screenings: ?PHQ2-9   ? ?Flowsheet Row Video Visit from 02/06/2022 in [facility] from 04/09/2017 in [facility] Office Visit from [facility]/07/2016 in [facility]  ?PHQ-2 Total Score 2 0 0  ?PHQ-9 Total Score 11 -- --  ? ?  ? ? ? ?Assessment and Plan:  ?PINCHOS TOPEL is a 46 y.o. year old male with a history of ADHD, type I diabetes (since age 46), who presents for follow up appointment for below.  ? ?1. MDD (major depressive disorder), recurrent, in partial remission ([place]) ?He denies significant mood symptoms since restarting sertraline.  Noted that although this is an improvement, he now denies any mood symptoms prior to starting sertraline. Psychosocial stressors includes marital conflict, which has been improving since starting couples therapy.  Other psychosocial  stressors includes estranged relationship with his father.  Will continue sertraline at the current dose at this time to target depression.  He will continue to see an individual therapist and couples therapist.

## 2022-03-20 ENCOUNTER — Telehealth (INDEPENDENT_AMBULATORY_CARE_PROVIDER_SITE_OTHER): Payer: No Typology Code available for payment source | Admitting: Psychiatry

## 2022-03-20 ENCOUNTER — Encounter: Payer: Self-pay | Admitting: Psychiatry

## 2022-03-20 DIAGNOSIS — F3341 Major depressive disorder, recurrent, in partial remission: Secondary | ICD-10-CM

## 2022-03-20 DIAGNOSIS — F909 Attention-deficit hyperactivity disorder, unspecified type: Secondary | ICD-10-CM | POA: Diagnosis not present

## 2022-03-20 MED ORDER — SERTRALINE HCL 50 MG PO TABS: 50.0000 mg | ORAL_TABLET | Freq: Every day | ORAL | 0 refills | Status: DC

## 2022-03-20 MED ORDER — METHYLPHENIDATE HCL ER (OSM) 27 MG PO TBCR: 27.0000 mg | EXTENDED_RELEASE_TABLET | Freq: Every day | ORAL | 0 refills | Status: AC

## 2022-03-20 NOTE — Patient Instructions (Signed)
Continue sertraline 50 mg daily  ?Start concerta 27 mg daily  ?Next appointment: 6/5 at 3:30, in person ? ?The next visit will be in person visit. Please arrive 15 mins before the scheduled time.  ? ?[facility]  ?Address: [address], [place], [place] [address]   ?

## 2022-04-09 ENCOUNTER — Telehealth: Payer: Self-pay

## 2022-04-09 NOTE — Telephone Encounter (Signed)
Left voice message to contact the office. When he calls back,  could you ask him if he is interested in trying atomoxetine? Side effects including nausea, dry mouth. Will monitor labs (to monitor any liver injury) at times moving forward.

## 2022-04-09 NOTE — Telephone Encounter (Signed)
pt called wants to talk to you about something else to take instead of the methylphenidate he can not find it on back order so he wants to try something else.

## 2022-04-11 ENCOUNTER — Other Ambulatory Visit: Payer: Self-pay | Admitting: Psychiatry

## 2022-04-11 NOTE — Telephone Encounter (Signed)
Discussed with the patient. He states that although they do have Concerta/methylphenidate, they have not been able to fill this as the medication needs prior authorization. Could you contact the pharmacy to sort this out. He was on this medication before, and the order is generic as well, fyi.

## 2022-04-11 NOTE — Telephone Encounter (Signed)
Pt left message he states he can not find medication so he wants you to call him to talk about what else he can take.

## 2022-04-19 NOTE — Progress Notes (Addendum)
BH MD/PA/NP OP Progress Note  04/22/2022 4:15 PM Carlos Wilkins  MRN:  [id number]  Chief Complaint:  Chief Complaint  Patient presents with   Follow-up   HPI:  This is a follow-up appointment for depression and an ADHD.  He states that he has been doing the same.  He has difficulty in completing things done, and doing multitasking, although he will be able to get things done eventually.  He reports fair relationship with his wife.  They continue to go to couples therapy, and they have been working on communication.  He reports frustration that although he says to his wife that he will do things ("anything"), he is unable to do it due to difficulty in attention.  He has been trying to make lists of things.  He feels like chasing squirrels.  His blood pressure has been high, and he has an upcoming appointment with his PCP next month.  He denies feeling depressed, and reports his mood has been good.  Although he has insomnia due to racing thoughts, he is not interested in pharmacological treatment at this time.  He denies change in appetite.  He denies SI.  According to the patient, he was told that Ritalin is back order at the pharmacy, and he has not been able to start any medication for ADHD.   BP 140/90 in Feb  Visit Diagnosis:    ICD-10-CM   1. MDD (major depressive disorder), recurrent, in partial remission ([place])  F33.41     2. Attention deficit hyperactivity disorder (ADHD), unspecified ADHD type  F90.9       Past Psychiatric History: Please see initial evaluation for full details. I have reviewed the history. No updates at this time.     Past Medical History:  Past Medical History:  Diagnosis Date   Callus of foot    Diabetes mellitus without complication ([place])    High-energy and high-protein diet monitoring encounter    Low testosterone     Past Surgical History:  Procedure Laterality Date   APPENDECTOMY     VASECTOMY      Family Psychiatric History: Please see initial  evaluation for full details. I have reviewed the history. No updates at this time.     Family History:  Family History  Problem Relation Age of Onset   Post-traumatic stress disorder Father        [place] Vet   Diabetes Father     Social History:  Social History   Socioeconomic History   Marital status: Married    Spouse name: Not on file   Number of children: Not on file   Years of education: Not on file   Highest education level: Not on file  Occupational History   Not on file  Tobacco Use   Smoking status: Never   Smokeless tobacco: Never  Vaping Use   Vaping Use: Never used  Substance and Sexual Activity   Alcohol use: No    Alcohol/week: 0.0 standard drinks   Drug use: No   Sexual activity: Yes    Partners: Female  Other Topics Concern   Not on file  Social History Narrative   Not on file   Social Determinants of Health   Financial Resource Strain: Not on file  Food Insecurity: Not on file  Transportation Needs: Not on file  Physical Activity: Not on file  Stress: Not on file  Social Connections: Not on file    Allergies: No Known Allergies  Metabolic Disorder Labs:  Lab Results  Component Value Date   HGBA1C 6.7 (H) 03/05/2016   No results found for: PROLACTIN Lab Results  Component Value Date   CHOL 179 03/05/2016   TRIG 92 03/05/2016   HDL 47 03/05/2016   CHOLHDL 3.8 03/05/2016   VLDL 22 02/07/2016   LDLCALC 114 (H) 03/05/2016   LDLCALC 111 (H) 02/07/2016   Lab Results  Component Value Date   TSH 2.580 03/05/2016   TSH 3.292 02/07/2016    Therapeutic Level Labs: No results found for: LITHIUM No results found for: VALPROATE No components found for:  CBMZ  Current Medications: Current Outpatient Medications  Medication Sig Dispense Refill   atorvastatin (LIPITOR) 10 MG tablet Take 10 mg by mouth at bedtime.     Blood Glucose Monitoring Suppl (CONTOUR NEXT EZ MONITOR) w/Device KIT      citalopram (CELEXA) 20 MG tablet 1 tablet  daily.     Continuous Blood Gluc Sensor (GUARDIAN SENSOR 3) MISC ,     doxycycline (VIBRAMYCIN) 100 MG capsule 1 capsule     glucose blood test strip      insulin aspart (NOVOLOG) 100 UNIT/ML injection Take 5-20 units up to 4 times daily as directed. Max daily dose 100 units.     insulin glargine (LANTUS) 100 UNIT/ML injection Inject into the skin at bedtime.     insulin lispro (HUMALOG) 100 UNIT/ML injection up to 150 units/day via insulin pump     methylphenidate (CONCERTA) 27 MG PO CR tablet Take 1 tablet (27 mg total) by mouth daily before breakfast. 30 tablet 0   Multiple Vitamin (MULTIVITAMIN) capsule Take by mouth.     tadalafil (CIALIS) 10 MG tablet Take 10 mg by mouth daily as needed.     methylphenidate (CONCERTA) 27 MG PO CR tablet Take 1 tablet (27 mg total) by mouth daily before breakfast. 30 tablet 0   sertraline (ZOLOFT) 50 MG tablet Take 1 tablet (50 mg total) by mouth daily. 90 tablet 0   No current facility-administered medications for this visit.     Musculoskeletal: Strength & Muscle Tone:  normal Gait & Station: normal Patient leans: N/A  Psychiatric Specialty Exam: Review of Systems  Psychiatric/Behavioral:  Positive for decreased concentration and sleep disturbance. Negative for agitation, behavioral problems, confusion, dysphoric mood, hallucinations, self-injury and suicidal ideas. The patient is not nervous/anxious and is not hyperactive.   All other systems reviewed and are negative.  Blood pressure (!) 151/84, pulse 65, temperature 98.5 F (36.9 C), temperature source Temporal, weight 216 lb 12.8 oz (98.3 kg).Body mass index is 38.4 kg/m.  General Appearance: Fairly Groomed  Eye Contact:  Good  Speech:  Clear and Coherent  Volume:  Normal  Mood:   good  Affect:  Appropriate, Congruent, and slightly tense  Thought Process:  Coherent  Orientation:  Full (Time, Place, and Person)  Thought Content: Logical   Suicidal Thoughts:  No  Homicidal Thoughts:   No  Memory:  Immediate;   Good  Judgement:  Good  Insight:  Good  Psychomotor Activity:  Normal  Concentration:  Concentration: Good and Attention Span: Good  Recall:  Good  Fund of Knowledge: Good  Language: Good  Akathisia:  No  Handed:  Right  AIMS (if indicated): not done  Assets:  Communication Skills Desire for Improvement  ADL's:  Intact  Cognition: WNL  Sleep:  Poor   Screenings: Camera operator Row Office Visit from 04/22/2022 in [place] Video Visit from  02/06/2022 in [place] [facility] from 02[facility]07/2016 in [facility]  PHQ-2 Total Score 0 2 0 0  PHQ-9 Total Score 5 11 -- --        Assessment and Plan:  Carlos Wilkins is a 46 y.o. year old male with a history of ADHD, type I diabetes (since age 32), who presents for follow up appointment for below.   1. MDD (major depressive disorder), recurrent, in partial remission ([place]) He denies any significant mood symptoms since restarting sertraline. Psychosocial stressors includes marital conflict, which has been improving since starting couples therapy.  Other psychosocial stressors includes estranged relationship with his father.  Will continue sertraline at the current dose to target depression.  Will continue to see an individual therapist and couples therapy.   2. Attention deficit hyperactivity disorder (ADHD), unspecified ADHD type He continues to struggle with symptoms of ADHD.  He reportedly underwent neuropsych evaluation, pending records from his previous psychiatrist.  Although the plan was to try Ritalin, he was unable to get it due to back order. He agrees to hold starting medication for ADHD given its potential adverse reaction of hypertension. He agrees to send records to our practice after he is seen by PCP next month.   Plan Continue sertraline 50 mg daily   Next appointment: 7/20 at 4:30 for 30 mins, in person - on melatonin 0.75 mg    Past trials: Bupropion- irritable  The patient demonstrates the following risk factors for suicide: Chronic risk factors for suicide include: psychiatric disorder of depression . Acute risk factors for suicide include: family or marital conflict. Protective factors for this patient include: positive social support, coping skills, and hope for the future. Considering these factors, the overall suicide risk at this point appears to be low. Patient is appropriate for outpatient follow up.       Collaboration of Care: Collaboration of Care: Other N/A  Patient/Guardian was advised Release of Information must be obtained prior to any record release in order to collaborate their care with an outside provider. Patient/Guardian was advised if they have not already done so to contact the registration department to sign all necessary forms in order for [place] to release information regarding their care.   Consent: Patient/Guardian gives verbal consent for treatment and assignment of benefits for services provided during this visit. Patient/Guardian expressed understanding and agreed to proceed.    Norman Clay, MD 04/22/2022, 4:15 PM

## 2022-04-22 ENCOUNTER — Encounter: Payer: Self-pay | Admitting: Psychiatry

## 2022-04-22 ENCOUNTER — Ambulatory Visit (INDEPENDENT_AMBULATORY_CARE_PROVIDER_SITE_OTHER): Payer: No Typology Code available for payment source | Admitting: Psychiatry

## 2022-04-22 VITALS — BP 151/84 | HR 65 | Temp 98.5°F | Wt 216.8 lb

## 2022-04-22 DIAGNOSIS — F3341 Major depressive disorder, recurrent, in partial remission: Secondary | ICD-10-CM | POA: Diagnosis not present

## 2022-04-22 DIAGNOSIS — F909 Attention-deficit hyperactivity disorder, unspecified type: Secondary | ICD-10-CM | POA: Diagnosis not present

## 2022-04-22 MED ORDER — SERTRALINE HCL 50 MG PO TABS: 50.0000 mg | ORAL_TABLET | Freq: Every day | ORAL | 0 refills | Status: AC

## 2022-04-22 NOTE — Telephone Encounter (Signed)
Asked pharmacy to send information over and have not received anything from them that a PA is needed.

## 2022-06-06 ENCOUNTER — Ambulatory Visit: Payer: No Typology Code available for payment source | Admitting: Psychiatry

## 2022-08-10 ENCOUNTER — Other Ambulatory Visit: Payer: Self-pay | Admitting: Psychiatry

## 2022-08-10 NOTE — Telephone Encounter (Signed)
Could you contact the patient to see if he is still on sertraline? I see that the record indicates he may be on citalopram. Hold refill at this time until we figure it out. Please also make a follow up appointment for medication to be continued. Thanks

## 2022-10-21 ENCOUNTER — Telehealth: Payer: Self-pay | Admitting: Nutrition

## 2022-10-21 NOTE — Telephone Encounter (Signed)
LVM to call me to schedule an appointment

## 2024-01-27 ENCOUNTER — Other Ambulatory Visit: Payer: Self-pay | Admitting: Physician Assistant

## 2024-01-27 DIAGNOSIS — M545 Low back pain, unspecified: Secondary | ICD-10-CM

## 2024-01-28 ENCOUNTER — Ambulatory Visit
Admission: RE | Admit: 2024-01-28 | Discharge: 2024-01-28 | Disposition: A | Discharge status: Home / Self Care | Source: Ambulatory Visit | Attending: Physician Assistant | Admitting: Physician Assistant

## 2024-01-28 DIAGNOSIS — M545 Low back pain, unspecified: Secondary | ICD-10-CM
